# Patient Record
Sex: Male | Born: 1965 | Race: White | Hispanic: No | Marital: Married | Smoking: Never smoker
Health system: Southern US, Community
[De-identification: ages and names within clinical notes are randomized; demographics above are authoritative.]

## PROBLEM LIST (undated history)

## (undated) ENCOUNTER — Emergency Department (HOSPITAL_COMMUNITY): Payer: BC Managed Care – PPO

## (undated) DIAGNOSIS — M1990 Unspecified osteoarthritis, unspecified site: Secondary | ICD-10-CM

## (undated) DIAGNOSIS — M259 Joint disorder, unspecified: Secondary | ICD-10-CM

## (undated) DIAGNOSIS — J45909 Unspecified asthma, uncomplicated: Secondary | ICD-10-CM

## (undated) DIAGNOSIS — U071 COVID-19: Secondary | ICD-10-CM

## (undated) DIAGNOSIS — Z9109 Other allergy status, other than to drugs and biological substances: Secondary | ICD-10-CM

## (undated) DIAGNOSIS — T4145XA Adverse effect of unspecified anesthetic, initial encounter: Secondary | ICD-10-CM

## (undated) DIAGNOSIS — H9313 Tinnitus, bilateral: Secondary | ICD-10-CM

## (undated) DIAGNOSIS — J3081 Allergic rhinitis due to animal (cat) (dog) hair and dander: Secondary | ICD-10-CM

## (undated) DIAGNOSIS — Z98811 Dental restoration status: Secondary | ICD-10-CM

## (undated) DIAGNOSIS — S52042A Displaced fracture of coronoid process of left ulna, initial encounter for closed fracture: Secondary | ICD-10-CM

## (undated) DIAGNOSIS — S52122A Displaced fracture of head of left radius, initial encounter for closed fracture: Secondary | ICD-10-CM

## (undated) DIAGNOSIS — T148XXA Other injury of unspecified body region, initial encounter: Secondary | ICD-10-CM

## (undated) DIAGNOSIS — J189 Pneumonia, unspecified organism: Secondary | ICD-10-CM

## (undated) DIAGNOSIS — T8859XA Other complications of anesthesia, initial encounter: Secondary | ICD-10-CM

## (undated) DIAGNOSIS — R7303 Prediabetes: Secondary | ICD-10-CM

## (undated) DIAGNOSIS — G4730 Sleep apnea, unspecified: Secondary | ICD-10-CM

## (undated) DIAGNOSIS — I10 Essential (primary) hypertension: Secondary | ICD-10-CM

## (undated) DIAGNOSIS — K219 Gastro-esophageal reflux disease without esophagitis: Secondary | ICD-10-CM

## (undated) HISTORY — PX: SHOULDER ARTHROSCOPY: SHX128

## (undated) HISTORY — PX: ANKLE SURGERY: SHX546

---

## 1998-10-11 ENCOUNTER — Emergency Department (HOSPITAL_COMMUNITY): Admission: EM | Admit: 1998-10-11 | Discharge: 1998-10-11 | Payer: Self-pay | Admitting: Emergency Medicine

## 1998-10-11 ENCOUNTER — Encounter: Payer: Self-pay | Admitting: Emergency Medicine

## 1999-12-31 ENCOUNTER — Emergency Department (HOSPITAL_COMMUNITY): Admission: EM | Admit: 1999-12-31 | Discharge: 1999-12-31 | Payer: Self-pay | Admitting: Emergency Medicine

## 2001-07-25 ENCOUNTER — Emergency Department (HOSPITAL_COMMUNITY): Admission: EM | Admit: 2001-07-25 | Discharge: 2001-07-25 | Payer: Self-pay | Admitting: Emergency Medicine

## 2003-08-30 ENCOUNTER — Emergency Department (HOSPITAL_COMMUNITY): Admission: EM | Admit: 2003-08-30 | Discharge: 2003-08-30 | Payer: Self-pay | Admitting: Emergency Medicine

## 2007-02-09 ENCOUNTER — Emergency Department (HOSPITAL_COMMUNITY): Admission: EM | Admit: 2007-02-09 | Discharge: 2007-02-09 | Payer: Self-pay | Admitting: Emergency Medicine

## 2009-07-11 ENCOUNTER — Ambulatory Visit (HOSPITAL_BASED_OUTPATIENT_CLINIC_OR_DEPARTMENT_OTHER): Admission: RE | Admit: 2009-07-11 | Discharge: 2009-07-11 | Payer: Self-pay | Admitting: Orthopedic Surgery

## 2009-07-11 HISTORY — PX: CARPAL TUNNEL RELEASE: SHX101

## 2009-08-15 ENCOUNTER — Ambulatory Visit (HOSPITAL_BASED_OUTPATIENT_CLINIC_OR_DEPARTMENT_OTHER): Admission: RE | Admit: 2009-08-15 | Discharge: 2009-08-15 | Payer: Self-pay | Admitting: Orthopedic Surgery

## 2009-08-15 HISTORY — PX: CARPAL TUNNEL RELEASE: SHX101

## 2010-05-12 LAB — POCT HEMOGLOBIN-HEMACUE: Hemoglobin: 15.6 g/dL (ref 13.0–17.0)

## 2010-05-13 LAB — POCT HEMOGLOBIN-HEMACUE: Hemoglobin: 15.3 g/dL (ref 13.0–17.0)

## 2011-02-21 ENCOUNTER — Emergency Department (HOSPITAL_COMMUNITY)
Admission: EM | Admit: 2011-02-21 | Discharge: 2011-02-22 | Disposition: A | Discharge status: Home / Self Care | Payer: BC Managed Care – PPO | Attending: Emergency Medicine | Admitting: Emergency Medicine

## 2011-02-21 ENCOUNTER — Emergency Department (HOSPITAL_COMMUNITY): Payer: BC Managed Care – PPO

## 2011-02-21 ENCOUNTER — Encounter: Payer: Self-pay | Admitting: *Deleted

## 2011-02-21 DIAGNOSIS — J3489 Other specified disorders of nose and nasal sinuses: Secondary | ICD-10-CM | POA: Insufficient documentation

## 2011-02-21 DIAGNOSIS — R509 Fever, unspecified: Secondary | ICD-10-CM | POA: Insufficient documentation

## 2011-02-21 DIAGNOSIS — R05 Cough: Secondary | ICD-10-CM | POA: Insufficient documentation

## 2011-02-21 DIAGNOSIS — R42 Dizziness and giddiness: Secondary | ICD-10-CM | POA: Insufficient documentation

## 2011-02-21 DIAGNOSIS — R0789 Other chest pain: Secondary | ICD-10-CM | POA: Insufficient documentation

## 2011-02-21 DIAGNOSIS — J45909 Unspecified asthma, uncomplicated: Secondary | ICD-10-CM | POA: Insufficient documentation

## 2011-02-21 DIAGNOSIS — J111 Influenza due to unidentified influenza virus with other respiratory manifestations: Secondary | ICD-10-CM | POA: Insufficient documentation

## 2011-02-21 DIAGNOSIS — R059 Cough, unspecified: Secondary | ICD-10-CM | POA: Insufficient documentation

## 2011-02-21 DIAGNOSIS — R0602 Shortness of breath: Secondary | ICD-10-CM | POA: Insufficient documentation

## 2011-02-21 DIAGNOSIS — R231 Pallor: Secondary | ICD-10-CM | POA: Insufficient documentation

## 2011-02-21 DIAGNOSIS — R000 Tachycardia, unspecified: Secondary | ICD-10-CM | POA: Insufficient documentation

## 2011-02-21 DIAGNOSIS — R070 Pain in throat: Secondary | ICD-10-CM | POA: Insufficient documentation

## 2011-02-21 DIAGNOSIS — R5381 Other malaise: Secondary | ICD-10-CM | POA: Insufficient documentation

## 2011-02-21 DIAGNOSIS — Z79899 Other long term (current) drug therapy: Secondary | ICD-10-CM | POA: Insufficient documentation

## 2011-02-21 DIAGNOSIS — R112 Nausea with vomiting, unspecified: Secondary | ICD-10-CM | POA: Insufficient documentation

## 2011-02-21 DIAGNOSIS — IMO0001 Reserved for inherently not codable concepts without codable children: Secondary | ICD-10-CM | POA: Insufficient documentation

## 2011-02-21 MED ORDER — ONDANSETRON HCL 4 MG/2ML IJ SOLN
4.0000 mg | Freq: Once | INTRAMUSCULAR | Status: AC
  Administered 2011-02-21: 4 mg via INTRAVENOUS
  Filled 2011-02-21: qty 2

## 2011-02-21 MED ORDER — SODIUM CHLORIDE 0.9 % IV BOLUS (SEPSIS)
1000.0000 mL | Freq: Once | INTRAVENOUS | Status: AC
  Administered 2011-02-21: 1000 mL via INTRAVENOUS

## 2011-02-21 MED ORDER — SODIUM CHLORIDE 0.9 % IV BOLUS (SEPSIS): 1000.0000 mL | Freq: Once | INTRAVENOUS | Status: DC

## 2011-02-21 MED ORDER — IPRATROPIUM BROMIDE 0.02 % IN SOLN
0.5000 mg | Freq: Once | RESPIRATORY_TRACT | Status: AC
  Administered 2011-02-21: 0.5 mg via RESPIRATORY_TRACT
  Filled 2011-02-21: qty 2.5

## 2011-02-21 MED ORDER — ALBUTEROL SULFATE (5 MG/ML) 0.5% IN NEBU
2.5000 mg | INHALATION_SOLUTION | Freq: Once | RESPIRATORY_TRACT | Status: AC
  Administered 2011-02-21: 2.5 mg via RESPIRATORY_TRACT
  Filled 2011-02-21: qty 0.5

## 2011-02-21 NOTE — ED Provider Notes (Signed)
Medical screening examination/treatment/procedure(s) were performed by non-physician practitioner and as supervising physician I was immediately available for consultation/collaboration.  Jacquis Paxton T Sahra Converse, MD 02/21/11 2340 

## 2011-02-21 NOTE — ED Notes (Signed)
Bed:WA04<BR> Expected date:<BR> Expected time:<BR> Means of arrival:<BR> Comments:<BR> hold

## 2011-02-21 NOTE — ED Notes (Signed)
Per EMS- pt in c/o flu like symptoms x4 days, c/o cough, n/v, fever and body aches

## 2011-02-21 NOTE — ED Provider Notes (Deleted)
History     CSN: [id number]  Arrival date & time 02/21/11  2008   First MD Initiated Contact with Patient 02/21/11 2045      Chief Complaint  Patient presents with  . Influenza    (Consider location/radiation/quality/duration/timing/severity/associated sxs/prior treatment) HPI Comments: This patient has had 4 days of flulike symptoms including nausea, vomiting, shortness of breath, cough, fevers, myalgias, has been taking over-the-counter medications with minimal relief.  Has an underlying history of asthma.  Tonight felt like he could not catch his breath and had a heaviness across his chest  Patient is a 45 y.o. male presenting with flu symptoms. The history is provided by the patient.  Influenza This is a new problem. The current episode started in the past 7 days. The problem occurs constantly. The problem has been gradually worsening. Associated symptoms include chills, congestion, coughing, a fever, myalgias, nausea, a sore throat, vomiting and weakness.    Past Medical History  Diagnosis Date  . Asthma     History reviewed. No pertinent past surgical history.  History reviewed. No pertinent family history.  History  Substance Use Topics  . Smoking status: Not on file  . Smokeless tobacco: Not on file  . Alcohol Use:       Review of Systems  Constitutional: Positive for fever and chills.  HENT: Positive for congestion and sore throat. Negative for rhinorrhea.   Respiratory: Positive for cough, chest tightness and shortness of breath. Negative for wheezing.   Gastrointestinal: Positive for nausea and vomiting.  Musculoskeletal: Positive for myalgias.  Skin: Positive for pallor.  Neurological: Positive for dizziness and weakness.  Psychiatric/Behavioral: Negative.     Allergies  Biaxin  Home Medications   Current Outpatient Rx  Name Route Sig Dispense Refill  . FLUTICASONE-SALMETEROL 100-50 MCG/DOSE IN AEPB Inhalation Inhale 1 puff into the lungs every  12 (twelve) hours.      Marland Kitchen LORATADINE 10 MG PO TABS Oral Take 10 mg by mouth daily.      Marland Kitchen MONTELUKAST SODIUM 10 MG PO TABS Oral Take 10 mg by mouth at bedtime.      Marland Kitchen AMLODIPINE BESYLATE 10 MG PO TABS Oral Take 10 mg by mouth daily.      Marland Kitchen BENAZEPRIL HCL 10 MG PO TABS Oral Take 10 mg by mouth daily.      Marland Kitchen ONDANSETRON HCL 4 MG PO TABS Oral Take 1 tablet (4 mg total) by mouth every 6 (six) hours. 12 tablet 0    BP 146/79  Pulse 94  Temp(Src) 100 F (37.8 C) (Oral)  Resp 18  SpO2 98%  Physical Exam  Constitutional: He is oriented to person, place, and time. He appears well-developed and well-nourished.  HENT:  Head: Normocephalic.  Eyes: Pupils are equal, round, and reactive to light.  Neck: Normal range of motion.  Cardiovascular: Tachycardia present.   Pulmonary/Chest: Effort normal and breath sounds normal. He has no wheezes. He exhibits no tenderness.  Musculoskeletal: He exhibits no tenderness.  Neurological: He is oriented to person, place, and time.  Skin: Skin is warm and dry. There is pallor.    ED Course  Procedures (including critical care time)  Labs Reviewed - No data to display Dg Chest 2 View  02/21/2011  *RADIOLOGY REPORT*  Clinical Data: Cough, shortness of breath, and chest pain.  CHEST - 2 VIEW  Comparison: None.  Findings: Slightly shallow inspiration.  Mild central peribronchial thickening suggesting changes of bronchitis.  No focal airspace consolidation in the  lungs.  No blunting of costophrenic angles. Normal heart size and pulmonary vascularity for technique. Degenerative changes in the thoracic spine.  Old left rib fractures.  IMPRESSION: Peribronchial thickening suggesting bronchitis.  No focal airspace consolidation.  Shallow inspiration.  Original Report Authenticated By: Marlon Pel, M.D.     1. Influenza     Patient still tachycardic, will administer additional IV fluids  MDM  Flulike symptoms for the last 4 days.  Will hydrate provide  antiemetics view x-ray, which is new pneumonia, but her central bronchial thickening.  Will administer albuterol, Atrovent neb reassess patient        Arman Filter, NP 02/21/11 2128  Arman Filter, NP 02/22/11 0058  Arman Filter, NP 02/26/11 1959  Arman Filter, NP 02/26/11 2000

## 2011-02-22 MED ORDER — ONDANSETRON HCL 4 MG PO TABS: 4.0000 mg | ORAL_TABLET | Freq: Four times a day (QID) | ORAL | Status: AC

## 2011-02-22 MED ORDER — SODIUM CHLORIDE 0.9 % IV BOLUS (SEPSIS)
1000.0000 mL | Freq: Once | INTRAVENOUS | Status: AC
  Administered 2011-02-22: 1000 mL via INTRAVENOUS

## 2011-02-22 MED ORDER — OXYCODONE-ACETAMINOPHEN 5-325 MG PO TABS
1.0000 | ORAL_TABLET | Freq: Once | ORAL | Status: AC
  Administered 2011-02-22: 1 via ORAL
  Filled 2011-02-22: qty 1

## 2011-02-26 NOTE — ED Provider Notes (Signed)
Medical screening examination/treatment/procedure(s) were performed by non-physician practitioner and as supervising physician I was immediately available for consultation/collaboration.   Lyanne Co, MD 02/26/11 2227

## 2013-08-15 ENCOUNTER — Encounter (INDEPENDENT_AMBULATORY_CARE_PROVIDER_SITE_OTHER): Payer: Self-pay

## 2013-08-15 ENCOUNTER — Other Ambulatory Visit: Payer: Self-pay | Admitting: Internal Medicine

## 2013-08-15 ENCOUNTER — Ambulatory Visit
Admission: RE | Admit: 2013-08-15 | Discharge: 2013-08-15 | Disposition: A | Discharge status: Home / Self Care | Payer: No Typology Code available for payment source | Source: Ambulatory Visit | Attending: Internal Medicine | Admitting: Internal Medicine

## 2013-08-15 DIAGNOSIS — M545 Low back pain: Secondary | ICD-10-CM

## 2013-09-23 ENCOUNTER — Ambulatory Visit (INDEPENDENT_AMBULATORY_CARE_PROVIDER_SITE_OTHER): Payer: PRIVATE HEALTH INSURANCE

## 2013-09-23 VITALS — BP 113/76 | HR 74 | Resp 16 | Ht 68.0 in | Wt 270.0 lb

## 2013-09-23 DIAGNOSIS — M7750 Other enthesopathy of unspecified foot: Secondary | ICD-10-CM

## 2013-09-23 DIAGNOSIS — M79673 Pain in unspecified foot: Secondary | ICD-10-CM

## 2013-09-23 DIAGNOSIS — M79609 Pain in unspecified limb: Secondary | ICD-10-CM

## 2013-09-23 DIAGNOSIS — G5760 Lesion of plantar nerve, unspecified lower limb: Secondary | ICD-10-CM

## 2013-09-23 DIAGNOSIS — M779 Enthesopathy, unspecified: Secondary | ICD-10-CM

## 2013-09-23 DIAGNOSIS — G5790 Unspecified mononeuropathy of unspecified lower limb: Secondary | ICD-10-CM

## 2013-09-23 DIAGNOSIS — M778 Other enthesopathies, not elsewhere classified: Secondary | ICD-10-CM

## 2013-09-23 MED ORDER — MELOXICAM 15 MG PO TABS: 15.0000 mg | ORAL_TABLET | Freq: Every day | ORAL | Status: DC

## 2013-09-23 NOTE — Progress Notes (Signed)
   Subjective:    Patient ID: Carl Graves, male    DOB: [birth year], 48 y.o.   MRN: [id number]  HPI Comments: "I have something going on with my feet"  Patient c/o tingling, numbness and discomfort forefoot and 4th and 5th toes bilateral for about 6 months. The tingling just started recently. He has experienced some burning and swelling lately. "The sheets on my bed make them sensitive now" He has seen PCP and he said he needed to lose weight and get new shoes. He's been using lotion at home.      Review of Systems  Constitutional: Positive for fatigue.  HENT: Positive for sinus pressure and tinnitus.   Eyes: Positive for redness and itching.  Respiratory: Positive for cough, chest tightness, shortness of breath and wheezing.   Musculoskeletal: Positive for back pain.  All other systems reviewed and are negative.      Objective:   Physical Exam 48 year old white male well-developed well-nourished oriented x3 presents at this time with continued pain and burning or appears to be neurologic type symptoms lateral aspects of both feet. Patient wearing athletic shoes with good foot orthotic lower extremity objective findings reveal intact neurovascular status with pedal pulses palpable DP +2/4 bilateral PT plus one over 4 bilateral capillary refill time 3 seconds all digits epicritic and proprioceptive sensations intact on Semmes Weinstein testing her patient does have hyperesthesia times and abnormal sensations feels like his socks or pulling or shifting shoes are rolling at times. Neurologically skin color pigment normal hair growth present but diminished distally nails are unremarkable speed biomechanical exam rectus foot mild digital contractures are noted no signs of fracture or other osseous abnormalities the hallux and lesser digits are rectus metatarsals are rectus mild inferior calcaneal spurring of fascial thickening noted there may be some slight subluxation Lisfranc for fifth metatarsal  base and cuboid bilateral. There is pain on direct lateral compression of the forefoot a lateral compression of third fourth and fifth toes producing some mild symptomology or paresthesia. Patient has a history of carpal tunnel and may also have similar neurologic absence of the foot. 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 At this time patient placed on a regimen of MOBIC 15 mg once daily also recommended straight shoe and ice pack to help reduce swelling and inflammation if no improvement with the next month followup for reevaluation other alternatives possibly gabapentin steroid injections or custom orthotics and shoe adjustments can be considered in the future.  Alvan Dameichard Reet Scharrer DPM

## 2013-09-23 NOTE — Patient Instructions (Signed)
ICE INSTRUCTIONS  Apply ice or cold pack to the affected area at least 3 times a day for 10-15 minutes each time.  You should also use ice after prolonged activity or vigorous exercise.  Do not apply ice longer than 20 minutes at one time.  Always keep a cloth between your skin and the ice pack to prevent burns.  Being consistent and following these instructions will help control your symptoms.  We suggest you purchase a gel ice pack because they are reusable and do bit leak.  Some of them are designed to wrap around the area.  Use the method that works best for you.  Here are some other suggestions for icing.   Use a frozen bag of peas or corn-inexpensive and molds well to your body, usually stays frozen for 10 to 20 minutes.  Wet a towel with cold water and squeeze out the excess until it's damp.  Place in a bag in the freezer for 20 minutes. Then remove and use.   Morton's Neuroma in Sports  (Interdigital Plantar Neuroma) Morton's neuroma is a condition of the nervous system that results in pain or loss of feeling in the toes. The disease is caused by the bones of the foot squeezing the nerve that runs between two toes (interdigital nerve). The third and fourth toes are most likely to be affected by this disease. SYMPTOMS   Tingling, numbness, burning, or electric shocks in the front of the foot, often involving the third and fourth toes, although it may involve any other pair of toes.  Pain and tenderness in the front of the foot, that gets worse when walking.  Pain that gets worse when pressure is applied to the foot (wearing shoes).  Severe pain in the front of the foot, when standing on the front of the foot (on tiptoes), such as with running, jumping, pivoting, or dancing. CAUSES  Morton's neuroma is caused by swelling of the nerve between two toes. This swelling causes the nerve to be pinched between the bones of the foot. RISK INCREASES WITH:  Recurring foot or ankle  injuries.  Poor fitting or worn shoes, with minimal padding and shock absorbers.  Loose ligaments of the foot, causing thickening of the nerve.  Poor foot strength and flexibility. PREVENTION  Warm up and stretch properly before activity.  Maintain physical fitness:  Foot and ankle flexibility.  Muscle strength and endurance.  Cardiovascular fitness.  Wear properly fitted and padded shoes.  Wear arch supports (orthotics), when needed. PROGNOSIS  If treated properly, Morton's neuroma can usually be cured with non-surgical treatment. For certain cases, surgery may be needed. RELATED COMPLICATIONS  Permanent numbness and pain in the foot.  Inability to participate in athletics, because of pain. TREATMENT Treatment first involves stopping any activities that make the symptoms worse. The use of ice and medicine will help reduce pain and inflammation. Wearing shoes with a wide toe box, and an orthotic arch support or metatarsal bar, may also reduce pain. Your caregiver may give you a corticosteroid injection, to further reduce inflammation. If non-surgical treatment is unsuccessful, surgery may be needed. Surgery to fix Morton's neuroma is often performed as an outpatient procedure, meaning you can go home the same day as the surgery. The procedure involves removing the source of pressure on the nerve. If it is necessary to remove the nerve, you can expect persistent numbness. MEDICATION  If pain medicine is needed, nonsteroidal anti-inflammatory medicines (aspirin and ibuprofen), or other minor pain relievers (  acetaminophen), are often advised.  Do not take pain medicine for 7 days before surgery.  Prescription pain relievers are usually prescribed only after surgery. Use only as directed and only as much as you need.  Corticosteroid injections are used in extreme cases, to reduce inflammation. These injections should be done only if necessary, because they may be given only a  limited number of times. HEAT AND COLD  Cold treatment (icing) should be applied for 10 to 15 minutes every 2 to 3 hours for inflammation and pain, and immediately after activity that aggravates your symptoms. Use ice packs or an ice massage.  Heat treatment may be used before performing stretching and strengthening activities prescribed by your caregiver, physical therapist, or athletic trainer. Use a heat pack or a warm water soak. SEEK MEDICAL CARE IF:   Symptoms get worse or do not improve in 2 weeks, despite treatment.  After surgery you develop increasing pain, swelling, redness, increased warmth, bleeding, drainage of fluids, or fever.  New, unexplained symptoms develop. (Drugs used in treatment may produce side effects.) Document Released: 12/18/2004 Document Revised: 05/05/2011 Document Reviewed: 05/25/2008 [facility] Patient Information 2015 [place], [place]. This information is not intended to replace advice given to you by your health care provider. Make sure you discuss any questions you have with your health care provider.   Neuroma or pinched nerve symptoms can be caused by improper fitting shoes. Maintain a straight last rather than a curved last shoe to prevent irritation or squeezing against the fourth and fifth toes

## 2014-02-23 ENCOUNTER — Encounter (HOSPITAL_COMMUNITY): Payer: Self-pay | Admitting: Family Medicine

## 2014-02-23 ENCOUNTER — Emergency Department (HOSPITAL_COMMUNITY): Payer: No Typology Code available for payment source

## 2014-02-23 ENCOUNTER — Emergency Department (HOSPITAL_COMMUNITY)
Admission: EM | Admit: 2014-02-23 | Discharge: 2014-02-23 | Disposition: A | Discharge status: Home / Self Care | Payer: No Typology Code available for payment source | Attending: Emergency Medicine | Admitting: Emergency Medicine

## 2014-02-23 DIAGNOSIS — R1110 Vomiting, unspecified: Secondary | ICD-10-CM | POA: Diagnosis not present

## 2014-02-23 DIAGNOSIS — Z7982 Long term (current) use of aspirin: Secondary | ICD-10-CM | POA: Diagnosis not present

## 2014-02-23 DIAGNOSIS — Z79899 Other long term (current) drug therapy: Secondary | ICD-10-CM | POA: Insufficient documentation

## 2014-02-23 DIAGNOSIS — J45901 Unspecified asthma with (acute) exacerbation: Secondary | ICD-10-CM | POA: Diagnosis not present

## 2014-02-23 DIAGNOSIS — R05 Cough: Secondary | ICD-10-CM | POA: Diagnosis present

## 2014-02-23 DIAGNOSIS — J029 Acute pharyngitis, unspecified: Secondary | ICD-10-CM | POA: Insufficient documentation

## 2014-02-23 DIAGNOSIS — R059 Cough, unspecified: Secondary | ICD-10-CM

## 2014-02-23 DIAGNOSIS — J40 Bronchitis, not specified as acute or chronic: Secondary | ICD-10-CM

## 2014-02-23 LAB — I-STAT CHEM 8, ED
BUN: 15 mg/dL (ref 6–23)
CREATININE: 0.7 mg/dL (ref 0.50–1.35)
Calcium, Ion: 1.15 mmol/L (ref 1.12–1.23)
Chloride: 102 mEq/L (ref 96–112)
GLUCOSE: 102 mg/dL — AB (ref 70–99)
HEMATOCRIT: 47 % (ref 39.0–52.0)
HEMOGLOBIN: 16 g/dL (ref 13.0–17.0)
Potassium: 3.7 mmol/L (ref 3.5–5.1)
SODIUM: 139 mmol/L (ref 135–145)
TCO2: 22 mmol/L (ref 0–100)

## 2014-02-23 MED ORDER — SODIUM CHLORIDE 0.9 % IV BOLUS (SEPSIS)
1000.0000 mL | Freq: Once | INTRAVENOUS | Status: AC
  Administered 2014-02-23: 1000 mL via INTRAVENOUS

## 2014-02-23 MED ORDER — PREDNISONE 50 MG PO TABS: 50.0000 mg | ORAL_TABLET | Freq: Every day | ORAL | Status: DC

## 2014-02-23 MED ORDER — ONDANSETRON HCL 4 MG PO TABS: 4.0000 mg | ORAL_TABLET | Freq: Four times a day (QID) | ORAL | Status: DC

## 2014-02-23 MED ORDER — ONDANSETRON HCL 4 MG/2ML IJ SOLN
4.0000 mg | Freq: Once | INTRAMUSCULAR | Status: AC
  Administered 2014-02-23: 4 mg via INTRAVENOUS
  Filled 2014-02-23: qty 2

## 2014-02-23 NOTE — Discharge Instructions (Signed)

## 2014-02-23 NOTE — ED Notes (Addendum)
Pt O2 sat at 86% placed 2L of 02 via Nasal Cannula. SPO2 improved to 96%.

## 2014-02-23 NOTE — ED Provider Notes (Signed)
CSN: [id number]     Arrival date & time 02/23/14  1014 History   First MD Initiated Contact with Patient 02/23/14 1025     Chief Complaint  Patient presents with  . Cough  . Sore Throat   Patient is a [age] y.o. male presenting with cough and pharyngitis.  Cough Sore Throat   Started this past weekend.  Initially it was a sore throat but then he started coughing.  [facility]. He was given a rx for thrush as well as cough medications.  His cough has been getting worse. He did not sleep well last night.  He has been vomiting but mostly after he coughs.  He feels tight in his chest and feel short of breath.  No fevers.,  Temp to 99. Past Medical History  Diagnosis Date  . Asthma    History reviewed. No pertinent past surgical history. History reviewed. No pertinent family history. History  Substance Use Topics  . Smoking status: Never Smoker   . Smokeless tobacco: Not on file  . Alcohol Use: Yes    Review of Systems  Respiratory: Positive for cough.   All other systems reviewed and are negative.     Allergies  Clarithromycin  Home Medications   Prior to Admission medications   Medication Sig Start Date End Date Taking? Authorizing Provider  albuterol (PROVENTIL HFA;VENTOLIN HFA) 108 (90 BASE) MCG/ACT inhaler Inhale 1-2 puffs into the lungs every 6 (six) hours as needed for wheezing or shortness of breath.   Yes Historical Provider, MD  albuterol (PROVENTIL) (2.5 MG/3ML) 0.083% nebulizer solution Take 2.5 mg by nebulization every 6 (six) hours as needed for wheezing or shortness of breath.   Yes Historical Provider, MD  amLODipine (NORVASC) 10 MG tablet Take 5 mg by mouth daily.    Yes Historical Provider, MD  aspirin 81 MG tablet Take 81 mg by mouth daily.   Yes Historical Provider, MD  benazepril (LOTENSIN) 20 MG tablet Take 20 mg by mouth daily.   Yes Historical Provider, MD  benzonatate (TESSALON) 100 MG capsule Take 100 mg by  mouth 3 (three) times daily as needed for cough.   Yes Historical Provider, MD  loratadine (CLARITIN) 10 MG tablet Take 10 mg by mouth daily.     Yes Historical Provider, MD  montelukast (SINGULAIR) 10 MG tablet Take 10 mg by mouth at bedtime.     Yes Historical Provider, MD  nystatin (MYCOSTATIN) 100000 UNIT/ML suspension Take 5 mLs by mouth 4 (four) times daily.   Yes Historical Provider, MD  Omega-3 Fatty Acids (FISH OIL PO) Take 1,200 mg by mouth daily.    Yes Historical Provider, MD  meloxicam (MOBIC) 15 MG tablet Take 1 tablet (15 mg total) by mouth daily. Patient not taking: Reported on 02/23/2014 09/23/13   Alvan Dameichard Sikora, DPM  ondansetron (ZOFRAN) 4 MG tablet Take 1 tablet (4 mg total) by mouth every 6 (six) hours. 02/23/14   Linwood DibblesJon Malakye Nolden, MD  predniSONE (DELTASONE) 50 MG tablet Take 1 tablet (50 mg total) by mouth daily. 02/23/14   Linwood DibblesJon Adrian Dinovo, MD   BP 125/75 mmHg  Pulse 88  Temp(Src) 98.4 F (36.9 C)  Resp 22  SpO2 92% Physical Exam  Constitutional: He appears well-developed and well-nourished. No distress.  HENT:  Head: Normocephalic and atraumatic.  Right Ear: External ear normal.  Left Ear: External ear normal.  Eyes: Conjunctivae are normal. Right eye exhibits no discharge. Left  eye exhibits no discharge. No scleral icterus.  Neck: Neck supple. No tracheal deviation present.  Cardiovascular: Normal rate, regular rhythm and intact distal pulses.   Pulmonary/Chest: Effort normal. No stridor. No respiratory distress. He has decreased breath sounds in the right lower field and the left lower field. He has no wheezes. He has rhonchi. He has no rales.  Abdominal: Soft. Bowel sounds are normal. He exhibits no distension. There is no tenderness. There is no rebound and no guarding.  Musculoskeletal: He exhibits no edema or tenderness.  Neurological: He is alert. He has normal strength. No cranial nerve deficit (no facial droop, extraocular movements intact, no slurred speech) or sensory  deficit. He exhibits normal muscle tone. He displays no seizure activity. Coordination normal.  Skin: Skin is warm and dry. No rash noted.  Psychiatric: He has a normal mood and affect.  Nursing note and vitals reviewed.   ED Course  Procedures (including critical care time) Labs Review Labs Reviewed  I-STAT CHEM 8, ED - Abnormal; Notable for the following:    Glucose, Bld 102 (*)    All other components within normal limits    Imaging Review Dg Chest 2 View  02/23/2014   CLINICAL DATA:  Productive cough for 5 days.  History of asthma  EXAM: CHEST  2 VIEW  COMPARISON:  February 21, 2011  FINDINGS: There is mild generalized interstitial prominence and central peribronchial thickening, stable. There is no frank edema or consolidation. The heart size and pulmonary vascularity are normal. No adenopathy. There is degenerative change in the thoracic spine.  IMPRESSION: Generalized interstitial prominence, most likely reflective of chronic inflammatory type change. This finding may be at least in part due to the chronic asthma. There is central peribronchial thickening which appears chronic and may be indicative of chronic bronchitis as well. No frank edema or consolidation.   Electronically Signed   By: Bretta BangWilliam  Woodruff M.D.   On: 02/23/2014 11:25   Medications  sodium chloride 0.9 % bolus 1,000 mL (1,000 mLs Intravenous New Bag/Given 02/23/14 1104)  ondansetron (ZOFRAN) injection 4 mg (4 mg Intravenous Given 02/23/14 1104)     MDM   Final diagnoses:  Cough  Bronchitis  Post-tussive emesis    Most likely viral trigger.  No pna on xray.  Does have history of reactive airway disease.  Will rx prednisone.  zofran for nausea.  Follow up with PCP    Linwood DibblesJon Jaan Fischel, MD 02/23/14 1320

## 2014-02-23 NOTE — ED Notes (Signed)
Pt comfortable with discharge and follow up instructions. Prescriptions x2. 

## 2014-02-23 NOTE — ED Notes (Signed)
Pt here for sore throat and coughing since Saturday. sts he cannot lay flat. sts [facility] and had [facility]. sts night sweats and he vomited 4 times this am.

## 2014-12-10 ENCOUNTER — Emergency Department (HOSPITAL_COMMUNITY): Payer: No Typology Code available for payment source

## 2014-12-10 ENCOUNTER — Encounter (HOSPITAL_COMMUNITY): Payer: Self-pay | Admitting: Emergency Medicine

## 2014-12-10 ENCOUNTER — Emergency Department (HOSPITAL_COMMUNITY)
Admission: EM | Admit: 2014-12-10 | Discharge: 2014-12-10 | Disposition: A | Discharge status: Home / Self Care | Payer: No Typology Code available for payment source | Attending: Emergency Medicine | Admitting: Emergency Medicine

## 2014-12-10 DIAGNOSIS — Z7982 Long term (current) use of aspirin: Secondary | ICD-10-CM | POA: Diagnosis not present

## 2014-12-10 DIAGNOSIS — S52122A Displaced fracture of head of left radius, initial encounter for closed fracture: Secondary | ICD-10-CM | POA: Insufficient documentation

## 2014-12-10 DIAGNOSIS — S4992XA Unspecified injury of left shoulder and upper arm, initial encounter: Secondary | ICD-10-CM | POA: Diagnosis present

## 2014-12-10 DIAGNOSIS — Y9289 Other specified places as the place of occurrence of the external cause: Secondary | ICD-10-CM | POA: Insufficient documentation

## 2014-12-10 DIAGNOSIS — S62102A Fracture of unspecified carpal bone, left wrist, initial encounter for closed fracture: Secondary | ICD-10-CM

## 2014-12-10 DIAGNOSIS — J45909 Unspecified asthma, uncomplicated: Secondary | ICD-10-CM | POA: Insufficient documentation

## 2014-12-10 DIAGNOSIS — Z79899 Other long term (current) drug therapy: Secondary | ICD-10-CM | POA: Insufficient documentation

## 2014-12-10 DIAGNOSIS — S52042A Displaced fracture of coronoid process of left ulna, initial encounter for closed fracture: Secondary | ICD-10-CM

## 2014-12-10 DIAGNOSIS — Y9389 Activity, other specified: Secondary | ICD-10-CM | POA: Diagnosis not present

## 2014-12-10 DIAGNOSIS — W010XXA Fall on same level from slipping, tripping and stumbling without subsequent striking against object, initial encounter: Secondary | ICD-10-CM | POA: Diagnosis not present

## 2014-12-10 DIAGNOSIS — I10 Essential (primary) hypertension: Secondary | ICD-10-CM | POA: Insufficient documentation

## 2014-12-10 DIAGNOSIS — Z7952 Long term (current) use of systemic steroids: Secondary | ICD-10-CM | POA: Insufficient documentation

## 2014-12-10 DIAGNOSIS — Y998 Other external cause status: Secondary | ICD-10-CM | POA: Diagnosis not present

## 2014-12-10 DIAGNOSIS — T148XXA Other injury of unspecified body region, initial encounter: Secondary | ICD-10-CM

## 2014-12-10 DIAGNOSIS — M25532 Pain in left wrist: Secondary | ICD-10-CM

## 2014-12-10 HISTORY — DX: Displaced fracture of coronoid process of left ulna, initial encounter for closed fracture: S52.042A

## 2014-12-10 HISTORY — DX: Other injury of unspecified body region, initial encounter: T14.8XXA

## 2014-12-10 HISTORY — DX: Essential (primary) hypertension: I10

## 2014-12-10 HISTORY — DX: Displaced fracture of head of left radius, initial encounter for closed fracture: S52.122A

## 2014-12-10 MED ORDER — HYDROCODONE-ACETAMINOPHEN 5-325 MG PO TABS: 1.0000 | ORAL_TABLET | Freq: Four times a day (QID) | ORAL | Status: DC | PRN

## 2014-12-10 MED ORDER — HYDROCODONE-ACETAMINOPHEN 5-325 MG PO TABS
2.0000 | ORAL_TABLET | Freq: Once | ORAL | Status: AC
  Administered 2014-12-10: 2 via ORAL
  Filled 2014-12-10: qty 2

## 2014-12-10 NOTE — ED Provider Notes (Addendum)
CSN: [id number]     Arrival date & time 12/10/14  1734 History   First MD Initiated Contact with Patient 12/10/14 1937     Chief Complaint  Patient presents with  . Arm Pain     (Consider location/radiation/quality/duration/timing/severity/associated sxs/prior Treatment) Patient is a 49 y.o. male presenting with arm pain. The history is provided by the patient.  Arm Pain Pertinent negatives include no headaches.  Patient c/o pain/injury to left elbow and wrist. States was getting a mower off the back of a truck, when he tripped, fell forward onto outstretched left arm.  Patient c/o constant, mod-severe, left elbow pain, moderate left wrist pain, worse w movement, esp rotation. No associated numbness or weakness. Skin intact. No shoulder pain. No neck or back pain. No head injury or headache. Patient denies any other pain or injury.  Right hand dom.    Past Medical History  Diagnosis Date  . Asthma   . Hypertension    No past surgical history on file. No family history on file. Social History  Substance Use Topics  . Smoking status: Never Smoker   . Smokeless tobacco: None  . Alcohol Use: Yes    Review of Systems  Constitutional: Negative for fever.  Musculoskeletal: Negative for back pain and neck pain.  Skin: Negative for wound.  Neurological: Negative for weakness, numbness and headaches.      Allergies  Clarithromycin  Home Medications   Prior to Admission medications   Medication Sig Start Date End Date Taking? Authorizing Provider  albuterol (PROVENTIL HFA;VENTOLIN HFA) 108 (90 BASE) MCG/ACT inhaler Inhale 1-2 puffs into the lungs every 6 (six) hours as needed for wheezing or shortness of breath.    Historical Provider, MD  albuterol (PROVENTIL) (2.5 MG/3ML) 0.083% nebulizer solution Take 2.5 mg by nebulization every 6 (six) hours as needed for wheezing or shortness of breath.    Historical Provider, MD  amLODipine (NORVASC) 10 MG tablet Take 5 mg by mouth  daily.     Historical Provider, MD  aspirin 81 MG tablet Take 81 mg by mouth daily.    Historical Provider, MD  benazepril (LOTENSIN) 20 MG tablet Take 20 mg by mouth daily.    Historical Provider, MD  benzonatate (TESSALON) 100 MG capsule Take 100 mg by mouth 3 (three) times daily as needed for cough.    Historical Provider, MD  loratadine (CLARITIN) 10 MG tablet Take 10 mg by mouth daily.      Historical Provider, MD  meloxicam (MOBIC) 15 MG tablet Take 1 tablet (15 mg total) by mouth daily. Patient not taking: Reported on 02/23/2014 09/23/13   Alvan Dame, DPM  montelukast (SINGULAIR) 10 MG tablet Take 10 mg by mouth at bedtime.      Historical Provider, MD  nystatin (MYCOSTATIN) 100000 UNIT/ML suspension Take 5 mLs by mouth 4 (four) times daily.    Historical Provider, MD  Omega-3 Fatty Acids (FISH OIL PO) Take 1,200 mg by mouth daily.     Historical Provider, MD  ondansetron (ZOFRAN) 4 MG tablet Take 1 tablet (4 mg total) by mouth every 6 (six) hours. 02/23/14   Linwood Dibbles, MD  predniSONE (DELTASONE) 50 MG tablet Take 1 tablet (50 mg total) by mouth daily. 02/23/14   Linwood Dibbles, MD   There were no vitals taken for this visit. Physical Exam  Constitutional: He is oriented to person, place, and time. He appears well-developed and well-nourished. No distress.  HENT:  Head: Atraumatic.  Eyes: Conjunctivae are normal.  Neck: Neck supple. No tracheal deviation present.  Cardiovascular: Normal rate and intact distal pulses.   Pulmonary/Chest: Effort normal. No accessory muscle usage. No respiratory distress.  Musculoskeletal: He exhibits tenderness.  Mild sts/tenderness left elbow and wrist. Pt holds left elbow flexed to 90 degrees. Radial pulse 2+. Mod tenderness at elbow. Mild diffuse wrist tenderness, no focal scaphoid tenderness. Forearm compartments soft, not tense. CTLS spine, non tender, aligned, no step off. Good rom left shoulder without pain.   Neurological: He is alert and oriented  to person, place, and time.  LUE, radial/median/unlar nerve fxn, motor and sensory, intact.   Skin: Skin is warm and dry. He is not diaphoretic.  Intact, no wounds.   Psychiatric: He has a normal mood and affect.  Nursing note and vitals reviewed.   ED Course  Procedures (including critical care time)  Dg Forearm Left  12/10/2014  CLINICAL DATA:  Trip and fall landing on left forearm. Now with left forearm pain and decreased range of motion. EXAM: LEFT FOREARM - 2 VIEW COMPARISON:  None. FINDINGS: There is a mildly displaced fracture of the radial neck, with questionable extension to the articular surface of the radial head. Questionable small elbow joint effusion. The more distal radius and ulna are intact. Tiny olecranon spur. There are no radiopaque foreign bodies. IMPRESSION: Mildly displaced radial neck fracture with questionable extension to the articular surface of the radial head. Electronically Signed   By: Rubye OaksMelanie  Ehinger M.D.   On: 12/10/2014 18:14   Dg Wrist 2 Views Left  12/10/2014  CLINICAL DATA:  Status post trip and fall today with left wrist injury and pain. Initial encounter. EXAM: LEFT WRIST - 2 VIEW COMPARISON:  None. FINDINGS: No acute bony or joint abnormality is identified. There is ulnar minus variance. Chondrocalcinosis of the triangular fibrocartilage is noted. IMPRESSION: No acute abnormality. Ulnar minus variance. Chondrocalcinosis. Electronically Signed   By: Drusilla Kannerhomas  Dalessio M.D.   On: 12/10/2014 20:24      I have personally reviewed and evaluated these images and lab results as part of my medical decision-making.   MDM   Xrays.  Pt has ride, does not have to drive.  No meds pta.  Hydrocodone po.  Splint, long arm posterior.   Ortho/hand consulted.  Ice.   Discussed pt with Dr Merlyn LotKuzma - indicates splint, d/c, call office tomorrow for follow up.   Recheck radial pulse 2+. No numbness/weakness. Pain controlled.     Cathren LaineKevin Nykole Matos, MD 12/10/14  2038

## 2014-12-10 NOTE — ED Notes (Signed)
Per EMS: Pt was getting a mower off back of a truck, mower started to roll, went to try to stop it and tripped and fell.  Landed on lt forearm.  No deformity.  Pain on rotation.

## 2014-12-10 NOTE — Discharge Instructions (Signed)
It was our pleasure to provide your ER care today - we hope that you feel better.  Keep splint clean and dry.  Elevate arm/elbow, as much as possible, above the level of your heart.   Icepack/cold to sore area.  Take motrin or aleve as need for pain. You may also take hydrocodone as need for pain. No driving when taking hydrocodone. Also, do not take tylenol or acetaminophen containing medication when taking hydrocodone.  Follow up with orthopedic arm/hand specialist in the next few days - see referral - call their office tomorrow morning to arrange appointment.  Your blood pressure is high tonight - follow up with primary care doctor for recheck in the next 1-2 weeks.   Return to ER if worse, new symptoms, severe or intractable pain, numbness/weakness, other concern.  You were given pain medication in the ER - no driving for the next 4 hours.     Radial Head Fracture A radial head fracture is a break of the smaller bone (radius) in the forearm. The head of this bone is the part near the elbow. These fractures commonly happen during a fall, when you land on an outstretched arm. These fractures are more common in middle aged adults and are common with a dislocation of the elbow. SYMPTOMS   Swelling of the elbow joint and pain on the outside of the elbow.  Pain and difficulty in bending or straightening the elbow.  Pain and difficulty in turning the palm of the hand up or down with the elbow bent. DIAGNOSIS  Your caregiver may make this diagnosis by a physical exam. X-rays can confirm the type and amount of fracture. Sometimes a fracture that is not displaced cannot be seen on the original X-ray. TREATMENT  Radial head fractures are classified according to the amount of movement (displacement) of parts from the normal position.  Type 1 Fractures  Type 1 fractures are generally small fractures in which bone pieces remain together (nondisplaced fracture).  The fracture may not be  seen on initial X-rays. Usually if X-rays are repeated two to three weeks later, the fracture will show up. A splint or sling is used for a few days. Gentle early motion is used to prevent the elbow from becoming stiff. It should not be done vigorously or forced as this could displace the bone pieces. Type 2 Fractures  With type 2 fractures, bone pieces are slightly displaced and larger pieces of bone are broken off.  If only a little displacement of the bone piece is present, splinting for 4 to 5 days usually works well. This is again followed with gentle active range of motion. Small fragments may be surgically removed.  Large pieces of bone that can be put back into place will sometimes be fixed with pins or screws to hold them until the bone is healed. If this cannot be done, the fragments are removed. For older, less active people, sometimes the entire radial head is removed if the wrist is not injured. The elbow and arm will still work fine. Soft tissue, tendon, and ligament injuries are corrected at the same time. Type 3 Fractures  Type 3 fractures have multiple broken pieces of bone that cannot be fixed. Surgery is usually needed to remove the broken bits of bone and what is left of the radial head. Soft-tissue damage is repaired. Gentle early motion is used to prevent the elbow from becoming stiff. Sometimes an artificial radial head can be used to prevent deformity if the  elbow is unstable. Rest, ice, elevation, immobilization, medications, and pain control are used in the early care. HOME CARE INSTRUCTIONS   Keep the injured part elevated while sitting or lying down. Keep the injury above the level of your heart (the center of the chest). This will decrease swelling and pain.  Apply ice to the injury for 15-20 minutes, 03-04 times per day while awake, for 2 days. Put the ice in a plastic bag and place a towel between the bag of ice and your cast or splint.  Move your fingers to avoid  stiffness and minimize swelling.  If you have a plaster or fiberglass cast:  Do not try to scratch the skin under the cast using sharp or pointed objects.  Check the skin around the cast every day. You may put lotion on any red or sore areas.  Keep your cast dry and clean.  If you have a plaster splint:  Wear the splint as directed.  You may loosen the elastic around the splint if your fingers become numb, tingle, or turn cold or blue.  Do not put pressure on any part of your cast or splint. It may break. Rest your cast only on a pillow for the first 24 hours until it is fully hardened.  Your cast or splint can be protected during bathing with a plastic bag. Do not lower the cast or splint into the water.  Only take over-the-counter or prescription medicines for pain, discomfort, or fever as directed by your caregiver.  Follow all instructions for follow-up with your caregiver. This includes any orthopedic referrals, physical therapy, and rehabilitation. Any delay in obtaining necessary care could result in a delay or failure of the bones to heal or permanent elbow stiffness.  Do not overdo exercises. This could further damage your injury. SEEK IMMEDIATE MEDICAL CARE IF:   Your cast or splint gets damaged or breaks.  You have more severe pain or swelling than you did before getting the cast.  You have severe pain when stretching your fingers.  There is a bad smell, new stains, and/or pus-like (purulent) drainage coming from under the cast.  Your fingers or hand turn pale or blue, become cold, or you lose feeling.   This information is not intended to replace advice given to you by your health care provider. Make sure you discuss any questions you have with your health care provider.   Document Released: 12/02/2005 Document Revised: 03/03/2014 Document Reviewed: 08/23/2014 Elsevier Interactive Patient Education 2016 [employer]   Cast or Splint Care Casts and splints  support injured limbs and keep bones from moving while they heal. It is important to care for your cast or splint at home.  HOME CARE INSTRUCTIONS  Keep the cast or splint uncovered during the drying period. It can take 24 to 48 hours to dry if it is made of plaster. A fiberglass cast will dry in less than 1 hour.  Do not rest the cast on anything harder than a pillow for the first 24 hours.  Do not put weight on your injured limb or apply pressure to the cast until your health care provider gives you permission.  Keep the cast or splint dry. Wet casts or splints can lose their shape and may not support the limb as well. A wet cast that has lost its shape can also create harmful pressure on your skin when it dries. Also, wet skin can become infected.  Cover the cast or splint with a  plastic bag when bathing or when out in the rain or snow. If the cast is on the trunk of the body, take sponge baths until the cast is removed.  If your cast does become wet, dry it with a towel or a blow dryer on the cool setting only.  Keep your cast or splint clean. Soiled casts may be wiped with a moistened cloth.  Do not place any hard or soft foreign objects under your cast or splint, such as cotton, toilet paper, lotion, or powder.  Do not try to scratch the skin under the cast with any object. The object could get stuck inside the cast. Also, scratching could lead to an infection. If itching is a problem, use a blow dryer on a cool setting to relieve discomfort.  Do not trim or cut your cast or remove padding from inside of it.  Exercise all joints next to the injury that are not immobilized by the cast or splint. For example, if you have a long leg cast, exercise the hip joint and toes. If you have an arm cast or splint, exercise the shoulder, elbow, thumb, and fingers.  Elevate your injured arm or leg on 1 or 2 pillows for the first 1 to 3 days to decrease swelling and pain.It is best if you can  comfortably elevate your cast so it is higher than your heart. SEEK MEDICAL CARE IF:   Your cast or splint cracks.  Your cast or splint is too tight or too loose.  You have unbearable itching inside the cast.  Your cast becomes wet or develops a soft spot or area.  You have a bad smell coming from inside your cast.  You get an object stuck under your cast.  Your skin around the cast becomes red or raw.  You have new pain or worsening pain after the cast has been applied. SEEK IMMEDIATE MEDICAL CARE IF:   You have fluid leaking through the cast.  You are unable to move your fingers or toes.  You have discolored (blue or white), cool, painful, or very swollen fingers or toes beyond the cast.  You have tingling or numbness around the injured area.  You have severe pain or pressure under the cast.  You have any difficulty with your breathing or have shortness of breath.  You have chest pain.   This information is not intended to replace advice given to you by your health care provider. Make sure you discuss any questions you have with your health care provider.   Document Released: 02/08/2000 Document Revised: 12/01/2012 Document Reviewed: 08/19/2012 Elsevier Interactive Patient Education 2016 [employer] Inc[employer]     Cryotherapy Cryotherapy is when you put ice on your injury. Ice helps lessen pain and puffiness (swelling) after an injury. Ice works the best when you start using it in the first 24 to 48 hours after an injury. HOME CARE  Put a dry or damp towel between the ice pack and your skin.  You may press gently on the ice pack.  Leave the ice on for no more than 10 to 20 minutes at a time.  Check your skin after 5 minutes to make sure your skin is okay.  Rest at least 20 minutes between ice pack uses.  Stop using ice when your skin loses feeling (numbness).  Do not use ice on someone who cannot tell you when it hurts. This includes small children and people with  memory problems (dementia). GET HELP RIGHT AWAY  IF:  You have white spots on your skin.  Your skin turns blue or pale.  Your skin feels waxy or hard.  Your puffiness gets worse. MAKE SURE YOU:   Understand these instructions.  Will watch your condition.  Will get help right away if you are not doing well or get worse.   This information is not intended to replace advice given to you by your health care provider. Make sure you discuss any questions you have with your health care provider.   Hypertension Hypertension, commonly called high blood pressure, is when the force of blood pumping through your arteries is too strong. Your arteries are the blood vessels that carry blood from your heart throughout your body. A blood pressure reading consists of a higher number over a lower number, such as 110/72. The higher number (systolic) is the pressure inside your arteries when your heart pumps. The lower number (diastolic) is the pressure inside your arteries when your heart relaxes. Ideally you want your blood pressure below 120/80. Hypertension forces your heart to work harder to pump blood. Your arteries may become narrow or stiff. Having untreated or uncontrolled hypertension can cause heart attack, stroke, kidney disease, and other problems. RISK FACTORS Some risk factors for high blood pressure are controllable. Others are not.  Risk factors you cannot control include:   Race. You may be at higher risk if you are African American.  Age. Risk increases with age.  Gender. Men are at higher risk than women before age 45 years. After age 22, women are at higher risk than men. Risk factors you can control include:  Not getting enough exercise or physical activity.  Being overweight.  Getting too much fat, sugar, calories, or salt in your diet.  Drinking too much alcohol. SIGNS AND SYMPTOMS Hypertension does not usually cause signs or symptoms. Extremely high blood pressure  (hypertensive crisis) may cause headache, anxiety, shortness of breath, and nosebleed. DIAGNOSIS To check if you have hypertension, your health care provider will measure your blood pressure while you are seated, with your arm held at the level of your heart. It should be measured at least twice using the same arm. Certain conditions can cause a difference in blood pressure between your right and left arms. A blood pressure reading that is higher than normal on one occasion does not mean that you need treatment. If it is not clear whether you have high blood pressure, you may be asked to return on a different day to have your blood pressure checked again. Or, you may be asked to monitor your blood pressure at home for 1 or more weeks. TREATMENT Treating high blood pressure includes making lifestyle changes and possibly taking medicine. Living a healthy lifestyle can help lower high blood pressure. You may need to change some of your habits. Lifestyle changes may include:  Following the DASH diet. This diet is high in fruits, vegetables, and whole grains. It is low in salt, red meat, and added sugars.  Keep your sodium intake below 2,300 mg per day.  Getting at least 30-45 minutes of aerobic exercise at least 4 times per week.  Losing weight if necessary.  Not smoking.  Limiting alcoholic beverages.  Learning ways to reduce stress. Your health care provider may prescribe medicine if lifestyle changes are not enough to get your blood pressure under control, and if one of the following is true:  You are 19-52 years of age and your systolic blood pressure is above 140.  You are 55 years of age or older, and your systolic blood pressure is above 150.  Your diastolic blood pressure is above 90.  You have diabetes, and your systolic blood pressure is over 140 or your diastolic blood pressure is over 90.  You have kidney disease and your blood pressure is above 140/90.  You have heart disease  and your blood pressure is above 140/90. Your personal target blood pressure may vary depending on your medical conditions, your age, and other factors. HOME CARE INSTRUCTIONS  Have your blood pressure rechecked as directed by your health care provider.   Take medicines only as directed by your health care provider. Follow the directions carefully. Blood pressure medicines must be taken as prescribed. The medicine does not work as well when you skip doses. Skipping doses also puts you at risk for problems.  Do not smoke.   Monitor your blood pressure at home as directed by your health care provider. SEEK MEDICAL CARE IF:   You think you are having a reaction to medicines taken.  You have recurrent headaches or feel dizzy.  You have swelling in your ankles.  You have trouble with your vision. SEEK IMMEDIATE MEDICAL CARE IF:  You develop a severe headache or confusion.  You have unusual weakness, numbness, or feel faint.  You have severe chest or abdominal pain.  You vomit repeatedly.  You have trouble breathing. MAKE SURE YOU:   Understand these instructions.  Will watch your condition.  Will get help right away if you are not doing well or get worse.   This information is not intended to replace advice given to you by your health care provider. Make sure you discuss any questions you have with your health care provider.   Document Released: 02/10/2005 Document Revised: 06/27/2014 Document Reviewed: 12/03/2012 Elsevier Interactive Patient Education [employer]

## 2014-12-21 ENCOUNTER — Encounter (HOSPITAL_BASED_OUTPATIENT_CLINIC_OR_DEPARTMENT_OTHER): Payer: Self-pay | Admitting: *Deleted

## 2014-12-21 ENCOUNTER — Other Ambulatory Visit: Payer: Self-pay | Admitting: Orthopedic Surgery

## 2014-12-21 NOTE — Pre-Procedure Instructions (Signed)
To come for EKG 

## 2014-12-22 ENCOUNTER — Encounter (HOSPITAL_BASED_OUTPATIENT_CLINIC_OR_DEPARTMENT_OTHER)
Admission: RE | Admit: 2014-12-22 | Discharge: 2014-12-22 | Disposition: A | Discharge status: Home / Self Care | Payer: No Typology Code available for payment source | Source: Ambulatory Visit | Attending: Orthopedic Surgery | Admitting: Orthopedic Surgery

## 2014-12-22 ENCOUNTER — Other Ambulatory Visit: Payer: Self-pay

## 2014-12-22 DIAGNOSIS — Z01818 Encounter for other preprocedural examination: Secondary | ICD-10-CM | POA: Insufficient documentation

## 2014-12-25 NOTE — Anesthesia Preprocedure Evaluation (Addendum)
Anesthesia Evaluation  Patient identified by MRN, date of birth, ID band Patient awake    Reviewed: Allergy & Precautions, NPO status , Patient's Chart, lab work & pertinent test results  Airway Mallampati: I  TM Distance: >3 FB Neck ROM: Full    Dental  (+) Teeth Intact, Dental Advisory Given   Pulmonary    breath sounds clear to auscultation       Cardiovascular hypertension, Pt. on medications  Rhythm:Regular Rate:Normal     Neuro/Psych    GI/Hepatic GERD  Medicated and Controlled,  Endo/Other  Morbid obesity  Renal/GU      Musculoskeletal   Abdominal   Peds  Hematology   Anesthesia Other Findings Pt seen on 12/25/14 for questionable cough with sputum production.  On exam his lungs were clear and he was afebrile (98.3).  He has environmental allergies that cause this type of reaction periodically. I think he is able to have his planned procedure on 12/26/14.  Reproductive/Obstetrics                           Anesthesia Physical Anesthesia Plan  ASA: III  Anesthesia Plan: General and Regional   Post-op Pain Management:    Induction: Intravenous  Airway Management Planned: LMA  Additional Equipment:   Intra-op Plan:   Post-operative Plan: Extubation in OR  Informed Consent: I have reviewed the patients History and Physical, chart, labs and discussed the procedure including the risks, benefits and alternatives for the proposed anesthesia with the patient or authorized representative who has indicated his/her understanding and acceptance.   Dental advisory given  Plan Discussed with: CRNA, Anesthesiologist and Surgeon  Anesthesia Plan Comments:         Anesthesia Quick Evaluation

## 2014-12-26 ENCOUNTER — Ambulatory Visit (HOSPITAL_BASED_OUTPATIENT_CLINIC_OR_DEPARTMENT_OTHER)
Admission: RE | Admit: 2014-12-26 | Discharge: 2014-12-26 | Disposition: A | Discharge status: Home / Self Care | Payer: No Typology Code available for payment source | Source: Ambulatory Visit | Attending: Orthopedic Surgery | Admitting: Orthopedic Surgery

## 2014-12-26 ENCOUNTER — Encounter (HOSPITAL_BASED_OUTPATIENT_CLINIC_OR_DEPARTMENT_OTHER): Payer: Self-pay | Admitting: *Deleted

## 2014-12-26 ENCOUNTER — Ambulatory Visit (HOSPITAL_BASED_OUTPATIENT_CLINIC_OR_DEPARTMENT_OTHER): Payer: No Typology Code available for payment source | Admitting: Anesthesiology

## 2014-12-26 ENCOUNTER — Encounter (HOSPITAL_BASED_OUTPATIENT_CLINIC_OR_DEPARTMENT_OTHER)
Admission: RE | Disposition: A | Discharge status: Home / Self Care | Payer: Self-pay | Source: Ambulatory Visit | Attending: Orthopedic Surgery

## 2014-12-26 DIAGNOSIS — Z881 Allergy status to other antibiotic agents status: Secondary | ICD-10-CM | POA: Insufficient documentation

## 2014-12-26 DIAGNOSIS — S52042A Displaced fracture of coronoid process of left ulna, initial encounter for closed fracture: Secondary | ICD-10-CM | POA: Diagnosis not present

## 2014-12-26 DIAGNOSIS — M24232 Disorder of ligament, left wrist: Secondary | ICD-10-CM | POA: Insufficient documentation

## 2014-12-26 DIAGNOSIS — Z6841 Body Mass Index (BMI) 40.0 and over, adult: Secondary | ICD-10-CM | POA: Diagnosis not present

## 2014-12-26 DIAGNOSIS — K219 Gastro-esophageal reflux disease without esophagitis: Secondary | ICD-10-CM | POA: Diagnosis not present

## 2014-12-26 DIAGNOSIS — W208XXA Other cause of strike by thrown, projected or falling object, initial encounter: Secondary | ICD-10-CM | POA: Diagnosis not present

## 2014-12-26 DIAGNOSIS — S52122A Displaced fracture of head of left radius, initial encounter for closed fracture: Secondary | ICD-10-CM | POA: Diagnosis not present

## 2014-12-26 DIAGNOSIS — Y9289 Other specified places as the place of occurrence of the external cause: Secondary | ICD-10-CM | POA: Diagnosis not present

## 2014-12-26 DIAGNOSIS — I10 Essential (primary) hypertension: Secondary | ICD-10-CM | POA: Insufficient documentation

## 2014-12-26 DIAGNOSIS — Y9389 Activity, other specified: Secondary | ICD-10-CM | POA: Diagnosis not present

## 2014-12-26 DIAGNOSIS — Z888 Allergy status to other drugs, medicaments and biological substances status: Secondary | ICD-10-CM | POA: Diagnosis not present

## 2014-12-26 DIAGNOSIS — J45909 Unspecified asthma, uncomplicated: Secondary | ICD-10-CM | POA: Insufficient documentation

## 2014-12-26 DIAGNOSIS — Y998 Other external cause status: Secondary | ICD-10-CM | POA: Insufficient documentation

## 2014-12-26 HISTORY — DX: Other complications of anesthesia, initial encounter: T88.59XA

## 2014-12-26 HISTORY — DX: Tinnitus, bilateral: H93.13

## 2014-12-26 HISTORY — DX: Dental restoration status: Z98.811

## 2014-12-26 HISTORY — DX: Displaced fracture of head of left radius, initial encounter for closed fracture: S52.122A

## 2014-12-26 HISTORY — DX: Gastro-esophageal reflux disease without esophagitis: K21.9

## 2014-12-26 HISTORY — DX: Allergic rhinitis due to animal (cat) (dog) hair and dander: J30.81

## 2014-12-26 HISTORY — PX: ORIF ELBOW FRACTURE: SHX5031

## 2014-12-26 HISTORY — PX: RADIAL HEAD ARTHROPLASTY: SHX6044

## 2014-12-26 HISTORY — DX: Joint disorder, unspecified: M25.9

## 2014-12-26 HISTORY — DX: Adverse effect of unspecified anesthetic, initial encounter: T41.45XA

## 2014-12-26 HISTORY — DX: Other injury of unspecified body region, initial encounter: T14.8XXA

## 2014-12-26 HISTORY — DX: Other allergy status, other than to drugs and biological substances: Z91.09

## 2014-12-26 HISTORY — DX: Displaced fracture of coronoid process of left ulna, initial encounter for closed fracture: S52.042A

## 2014-12-26 HISTORY — PX: ULNAR COLLATERAL LIGAMENT REPAIR: SHX6159

## 2014-12-26 HISTORY — DX: Unspecified asthma, uncomplicated: J45.909

## 2014-12-26 SURGERY — ARTHROPLASTY, RADIUS, HEAD
Anesthesia: Regional | Site: Elbow | Laterality: Left

## 2014-12-26 MED ORDER — ROPIVACAINE HCL 5 MG/ML IJ SOLN
INTRAMUSCULAR | Status: DC | PRN
Start: 2014-12-26 — End: 2014-12-26
  Administered 2014-12-26: 10 mL via PERINEURAL

## 2014-12-26 MED ORDER — FENTANYL CITRATE (PF) 100 MCG/2ML IJ SOLN
INTRAMUSCULAR | Status: AC
  Filled 2014-12-26: qty 2

## 2014-12-26 MED ORDER — GLYCOPYRROLATE 0.2 MG/ML IJ SOLN: 0.2000 mg | Freq: Once | INTRAMUSCULAR | Status: DC | PRN

## 2014-12-26 MED ORDER — MEPERIDINE HCL 25 MG/ML IJ SOLN: 6.2500 mg | INTRAMUSCULAR | Status: DC | PRN

## 2014-12-26 MED ORDER — METOCLOPRAMIDE HCL 5 MG/ML IJ SOLN
INTRAMUSCULAR | Status: AC
  Filled 2014-12-26: qty 2

## 2014-12-26 MED ORDER — ONDANSETRON HCL 4 MG/2ML IJ SOLN
INTRAMUSCULAR | Status: AC
  Filled 2014-12-26: qty 2

## 2014-12-26 MED ORDER — LIDOCAINE HCL (CARDIAC) 20 MG/ML IV SOLN
INTRAVENOUS | Status: DC | PRN
  Administered 2014-12-26: 75 mg via INTRAVENOUS

## 2014-12-26 MED ORDER — OXYCODONE-ACETAMINOPHEN 5-325 MG PO TABS: ORAL_TABLET | ORAL | Status: DC

## 2014-12-26 MED ORDER — MIDAZOLAM HCL 2 MG/2ML IJ SOLN
1.0000 mg | INTRAMUSCULAR | Status: DC | PRN
  Administered 2014-12-26: 2 mg via INTRAVENOUS

## 2014-12-26 MED ORDER — DEXAMETHASONE SODIUM PHOSPHATE 4 MG/ML IJ SOLN
INTRAMUSCULAR | Status: DC | PRN
  Administered 2014-12-26: 10 mg via INTRAVENOUS

## 2014-12-26 MED ORDER — ONDANSETRON HCL 4 MG/2ML IJ SOLN
INTRAMUSCULAR | Status: DC | PRN
Start: 2014-12-26 — End: 2014-12-26
  Administered 2014-12-26: 4 mg via INTRAVENOUS

## 2014-12-26 MED ORDER — METOCLOPRAMIDE HCL 5 MG/5ML PO SOLN
10.0000 mg | Freq: Three times a day (TID) | ORAL | Status: DC
  Administered 2014-12-26: 10 mg via ORAL

## 2014-12-26 MED ORDER — SCOPOLAMINE 1 MG/3DAYS TD PT72: 1.0000 | MEDICATED_PATCH | Freq: Once | TRANSDERMAL | Status: DC | PRN

## 2014-12-26 MED ORDER — OXYCODONE HCL 5 MG PO TABS: 5.0000 mg | ORAL_TABLET | Freq: Once | ORAL | Status: DC | PRN

## 2014-12-26 MED ORDER — CEFAZOLIN SODIUM-DEXTROSE 2-3 GM-% IV SOLR
2.0000 g | INTRAVENOUS | Status: AC
  Administered 2014-12-26: 2 g via INTRAVENOUS

## 2014-12-26 MED ORDER — CHLORHEXIDINE GLUCONATE 4 % EX LIQD: 60.0000 mL | Freq: Once | CUTANEOUS | Status: DC

## 2014-12-26 MED ORDER — OXYCODONE HCL 5 MG/5ML PO SOLN: 5.0000 mg | Freq: Once | ORAL | Status: DC | PRN

## 2014-12-26 MED ORDER — FENTANYL CITRATE (PF) 100 MCG/2ML IJ SOLN
50.0000 ug | INTRAMUSCULAR | Status: DC | PRN
  Administered 2014-12-26: 100 ug via INTRAVENOUS

## 2014-12-26 MED ORDER — PROMETHAZINE HCL 25 MG/ML IJ SOLN
12.5000 mg | Freq: Once | INTRAMUSCULAR | Status: AC
  Administered 2014-12-26: 12.5 mg via INTRAVENOUS

## 2014-12-26 MED ORDER — LACTATED RINGERS IV SOLN
INTRAVENOUS | Status: DC
  Administered 2014-12-26 (×2): via INTRAVENOUS

## 2014-12-26 MED ORDER — DEXAMETHASONE SODIUM PHOSPHATE 10 MG/ML IJ SOLN
INTRAMUSCULAR | Status: AC
  Filled 2014-12-26: qty 1

## 2014-12-26 MED ORDER — PROPOFOL 10 MG/ML IV BOLUS
INTRAVENOUS | Status: DC | PRN
  Administered 2014-12-26: 250 mg via INTRAVENOUS

## 2014-12-26 MED ORDER — MIDAZOLAM HCL 2 MG/2ML IJ SOLN
INTRAMUSCULAR | Status: AC
  Filled 2014-12-26: qty 2

## 2014-12-26 MED ORDER — PROMETHAZINE HCL 25 MG/ML IJ SOLN
INTRAMUSCULAR | Status: AC
  Filled 2014-12-26: qty 1

## 2014-12-26 MED ORDER — HYDROMORPHONE HCL 1 MG/ML IJ SOLN: 0.2500 mg | INTRAMUSCULAR | Status: DC | PRN

## 2014-12-26 MED ORDER — CEFAZOLIN SODIUM-DEXTROSE 2-3 GM-% IV SOLR
INTRAVENOUS | Status: AC
  Filled 2014-12-26: qty 50

## 2014-12-26 MED ORDER — BUPIVACAINE-EPINEPHRINE (PF) 0.5% -1:200000 IJ SOLN
INTRAMUSCULAR | Status: DC | PRN
Start: 2014-12-26 — End: 2014-12-26
  Administered 2014-12-26: 30 mL via PERINEURAL

## 2014-12-26 SURGICAL SUPPLY — 73 items
ANCH SUT 2 SHRT 1.45 DRLBT (Orthopedic Implant) ×1 IMPLANT
ANCHOR JUGGERKNOT W/DRL 2/1.45 (Orthopedic Implant) ×2 IMPLANT
ARH slide loc head 26mm, left ×2 IMPLANT
BANDAGE ELASTIC 3 VELCRO ST LF (GAUZE/BANDAGES/DRESSINGS) ×4 IMPLANT
BANDAGE ELASTIC 4 VELCRO ST LF (GAUZE/BANDAGES/DRESSINGS) ×3 IMPLANT
BLADE AVERAGE 25MMX9MM (BLADE) ×1
BLADE AVERAGE 25X9 (BLADE) ×2 IMPLANT
BLADE SURG 15 STRL LF DISP TIS (BLADE) ×2 IMPLANT
BLADE SURG 15 STRL SS (BLADE) ×6
BNDG CMPR 9X4 STRL LF SNTH (GAUZE/BANDAGES/DRESSINGS) ×1
BNDG ESMARK 4X9 LF (GAUZE/BANDAGES/DRESSINGS) ×3 IMPLANT
BNDG GAUZE ELAST 4 BULKY (GAUZE/BANDAGES/DRESSINGS) ×4 IMPLANT
CANISTER SUCT 1200ML W/VALVE (MISCELLANEOUS) ×3 IMPLANT
CHLORAPREP W/TINT 26ML (MISCELLANEOUS) ×3 IMPLANT
CORDS BIPOLAR (ELECTRODE) ×3 IMPLANT
COVER BACK TABLE 60X90IN (DRAPES) ×3 IMPLANT
COVER MAYO STAND STRL (DRAPES) ×3 IMPLANT
CUFF TOURNIQUET SINGLE 24IN (TOURNIQUET CUFF) ×2 IMPLANT
DRAPE EXTREMITY T 121X128X90 (DRAPE) ×3 IMPLANT
DRAPE OEC MINIVIEW 54X84 (DRAPES) ×3 IMPLANT
DRAPE SURG 17X23 STRL (DRAPES) ×3 IMPLANT
DRSG PAD ABDOMINAL 8X10 ST (GAUZE/BANDAGES/DRESSINGS) ×3 IMPLANT
GAUZE SPONGE 4X4 12PLY STRL (GAUZE/BANDAGES/DRESSINGS) ×3 IMPLANT
GAUZE XEROFORM 1X8 LF (GAUZE/BANDAGES/DRESSINGS) ×3 IMPLANT
GLOVE BIO SURGEON STRL SZ7.5 (GLOVE) ×5 IMPLANT
GLOVE BIOGEL PI IND STRL 8 (GLOVE) ×1 IMPLANT
GLOVE BIOGEL PI IND STRL 8.5 (GLOVE) ×1 IMPLANT
GLOVE BIOGEL PI INDICATOR 8 (GLOVE) ×2
GLOVE BIOGEL PI INDICATOR 8.5 (GLOVE) ×2
GLOVE SURG ORTHO 8.0 STRL STRW (GLOVE) ×3 IMPLANT
GLOVE SURG SS PI 8.5 STRL IVOR (GLOVE) ×2
GLOVE SURG SS PI 8.5 STRL STRW (GLOVE) ×1 IMPLANT
GOWN STRL REUS W/ TWL LRG LVL3 (GOWN DISPOSABLE) ×1 IMPLANT
GOWN STRL REUS W/ TWL XL LVL3 (GOWN DISPOSABLE) ×2 IMPLANT
GOWN STRL REUS W/TWL LRG LVL3 (GOWN DISPOSABLE) ×3
GOWN STRL REUS W/TWL XL LVL3 (GOWN DISPOSABLE) ×6 IMPLANT
K-WIRE SGLE END .054 LG (WIRE) ×6
KWIRE SGLE END .054 LG (WIRE) IMPLANT
NS IRRIG 1000ML POUR BTL (IV SOLUTION) ×3 IMPLANT
PACK BASIN DAY SURGERY FS (CUSTOM PROCEDURE TRAY) ×3 IMPLANT
PAD CAST 4YDX4 CTTN HI CHSV (CAST SUPPLIES) ×1 IMPLANT
PADDING CAST ABS 4INX4YD NS (CAST SUPPLIES) ×2
PADDING CAST ABS COTTON 4X4 ST (CAST SUPPLIES) ×1 IMPLANT
PADDING CAST COTTON 4X4 STRL (CAST SUPPLIES) ×3
SLEEVE SCD COMPRESS KNEE MED (MISCELLANEOUS) ×3 IMPLANT
SLIDE-LOC ARH NECK 1MM (Miscellaneous) ×2 IMPLANT
SLING ARM FOAM STRAP XLG (SOFTGOODS) ×2 IMPLANT
SPLINT FAST PLASTER 5X30 (CAST SUPPLIES) ×20
SPLINT PLASTER CAST FAST 5X30 (CAST SUPPLIES) IMPLANT
SPLINT PLASTER CAST XFAST 3X15 (CAST SUPPLIES) ×30 IMPLANT
SPLINT PLASTER XTRA FASTSET 3X (CAST SUPPLIES) ×20
STAPLER VISISTAT 35W (STAPLE) ×2 IMPLANT
STOCKINETTE 4X48 STRL (DRAPES) ×1 IMPLANT
STOCKINETTE 6  STRL (DRAPES) ×2
STOCKINETTE 6 STRL (DRAPES) IMPLANT
SUCTION FRAZIER TIP 10 FR DISP (SUCTIONS) ×2 IMPLANT
SUT ETHILON 4 0 PS 2 18 (SUTURE) ×3 IMPLANT
SUT FIBERWIRE #2 38 T-5 BLUE (SUTURE) ×3
SUT MNCRL AB 4-0 PS2 18 (SUTURE) ×3 IMPLANT
SUT STEEL 4 0 (SUTURE) ×2 IMPLANT
SUT VIC AB 2-0 SH 27 (SUTURE) ×3
SUT VIC AB 2-0 SH 27XBRD (SUTURE) IMPLANT
SUT VIC AB 3-0 PS1 18 (SUTURE) ×3
SUT VIC AB 3-0 PS1 18XBRD (SUTURE) IMPLANT
SUT VICRYL 4-0 PS2 18IN ABS (SUTURE) ×3 IMPLANT
SUTURE FIBERWR #2 38 T-5 BLUE (SUTURE) IMPLANT
SYR BULB 3OZ (MISCELLANEOUS) ×3 IMPLANT
TOWEL OR 17X24 6PK STRL BLUE (TOWEL DISPOSABLE) ×6 IMPLANT
TUBE ANAEROBIC SPECIMEN COL (MISCELLANEOUS) IMPLANT
TUBE CONNECTING 20'X1/4 (TUBING) ×1
TUBE CONNECTING 20X1/4 (TUBING) ×1 IMPLANT
UNDERPAD 30X30 (UNDERPADS AND DIAPERS) ×3 IMPLANT
arh slide loc standard stem 10mm ×2 IMPLANT

## 2014-12-26 NOTE — Progress Notes (Signed)
Assisted Dr. Crews with left, ultrasound guided, infraclavicular block. Side rails up, monitors on throughout procedure. See vital signs in flow sheet. Tolerated Procedure well. 

## 2014-12-26 NOTE — H&P (Signed)
Carl Graves is an 49 y.o. male.   Chief Complaint: left radial head and coronoid fractures HPI: 49 yo rhd male states he fell from standing height onto left arm trying to move away from lawn mower falling from truck.  [facility] revealed left radial head and coronoid fractures.  Splinted and followed up in office.  He reports no previous injury to left elbow.  Past Medical History  Diagnosis Date  . Tinnitus of both ears   . Acid reflux     occasional - no current med.  . Hypertension     has been on med. x 10 yr.; not completely controlled, per pt.  . Reactive airway disease     daily and prn inhalers  . Left radial head fracture 12/10/2014  . Fracture of coronoid process of left ulna 12/10/2014  . Ligament tear 12/10/2014    lateral collateral ligament tear elbow  . Cat allergies   . Allergic to dogs   . Pollen allergy   . Dental crown present   . Joint disorder     states jaw locks if opens mouth wide  . Complication of anesthesia     states is hard to wake up post-op    Past Surgical History  Procedure Laterality Date  . Carpal tunnel release Right 07/11/2009  . Carpal tunnel release Left 08/15/2009  . Shoulder arthroscopy Right   . Ankle surgery Right age 49    repair tendons and ligaments    History reviewed. No pertinent family history. Social History:  reports that he has never smoked. He has never used smokeless tobacco. He reports that he drinks alcohol. He reports that he does not use illicit drugs.  Allergies:  Allergies  Allergen Reactions  . Amlodipine Other (See Comments)    SWELLING OF LEGS  . Clarithromycin Nausea And Vomiting    Medications Prior to Admission  Medication Sig Dispense Refill  . albuterol (PROVENTIL HFA;VENTOLIN HFA) 108 (90 BASE) MCG/ACT inhaler Inhale 1-2 puffs into the lungs every 6 (six) hours as needed for wheezing or shortness of breath.    . ALPRAZolam (XANAX) 0.25 MG tablet Take 0.25 mg by mouth at bedtime as  needed for anxiety.    Marland Kitchen. aspirin 81 MG tablet Take 81 mg by mouth daily.    . benazepril (LOTENSIN) 20 MG tablet Take 20 mg by mouth daily.    . budesonide-formoterol (SYMBICORT) 160-4.5 MCG/ACT inhaler Inhale 2 puffs into the lungs 2 (two) times daily.    Marland Kitchen. HYDROcodone-acetaminophen (NORCO/VICODIN) 5-325 MG tablet Take 1-2 tablets by mouth every 6 (six) hours as needed for moderate pain. 30 tablet 0  . ibuprofen (ADVIL,MOTRIN) 200 MG tablet Take 200 mg by mouth every 6 (six) hours as needed.    . loratadine (CLARITIN) 10 MG tablet Take 10 mg by mouth daily.      . montelukast (SINGULAIR) 10 MG tablet Take 10 mg by mouth at bedtime.      . Omega-3 Fatty Acids (FISH OIL PO) Take 1,200 mg by mouth daily.       No results found for this or any previous visit (from the past 48 hour(s)).  No results found.   A comprehensive review of systems was negative except for: Eyes: positive for contacts/glasses Ears, nose, mouth, throat, and face: positive for tinnitus Respiratory: positive for asthma Musculoskeletal: positive for prior fracture  Blood pressure 126/70, pulse 83, temperature 98.3 F (36.8 C), temperature source Oral, resp. rate 15,  height  (1.727 m), weight 122.471 kg (270 lb), SpO2 99 %.  General appearance: alert, cooperative and appears stated age Head: Normocephalic, without obvious abnormality, atraumatic Neck: supple, symmetrical, trachea midline Resp: clear to auscultation bilaterally Cardio: regular rate and rhythm GI: non tender Extremities: intact sensation and capillary refill all digits.  +epl/fpl/io.  no wounds. Pulses: 2+ and symmetric Skin: Skin color, texture, turgor normal. No rashes or lesions Neurologic: Grossly normal Incision/Wound: none  Assessment/Plan Left radial head and coronoid fractures possible lateral ulnar collateral ligament injury.  Recommend OR for radial head arthroplasty, coronoid fixation, LUCL repair as necessary.  Risks, benefits, and  alternatives of surgery were discussed and the patient agrees with the plan of care.   Kiarra Kidd R 12/26/2014, 2:55 PM

## 2014-12-26 NOTE — Op Note (Signed)
Intra-operative fluoroscopic images in the AP, lateral, and oblique views were taken and evaluated by myself.  Reduction and hardware placement were confirmed.  There was no intraarticular penetration of permanent hardware.  

## 2014-12-26 NOTE — Anesthesia Procedure Notes (Addendum)
Anesthesia Regional Block:  Infraclavicular brachial plexus block  Pre-Anesthetic Checklist: ,, timeout performed, Correct Patient, Correct Site, Correct Laterality, Correct Procedure, Correct Position, site marked, Risks and benefits discussed,  Surgical consent,  Pre-op evaluation,  At surgeon's request and post-op pain management  Laterality: Left and Upper  Prep: chloraprep       Needles:  Injection technique: Single-shot  Needle Type: Echogenic Stimulator Needle     Needle Length: 5cm 5 cm Needle Gauge: 21 and 21 G    Additional Needles:  Procedures: ultrasound guided (picture in chart) Infraclavicular brachial plexus block Narrative:  Start time: 12/26/2014 2:03 PM End time: 12/26/2014 2:08 PM Injection made incrementally with aspirations every 5 mL.  Performed by: Personally  Anesthesiologist: CREWS, Dacoda   Procedure Name: LMA Insertion Date/Time: 12/26/2014 3:12 PM Performed by: Gar GibbonKEETON, Charlita Brian S Pre-anesthesia Checklist: Patient identified, Emergency Drugs available, Suction available and Patient being monitored Patient Re-evaluated:Patient Re-evaluated prior to inductionOxygen Delivery Method: Circle System Utilized Preoxygenation: Pre-oxygenation with 100% oxygen Intubation Type: IV induction Ventilation: Mask ventilation without difficulty LMA: LMA inserted LMA Size: 5.0 Number of attempts: 1 Airway Equipment and Method: Bite block Placement Confirmation: positive ETCO2 Tube secured with: Tape Dental Injury: Teeth and Oropharynx as per pre-operative assessment       Left infraclavicular block image

## 2014-12-26 NOTE — Discharge Instructions (Addendum)

## 2014-12-26 NOTE — Brief Op Note (Signed)
12/26/2014  5:01 PM  PATIENT:  Carl Graves  49 y.o. male  PRE-OPERATIVE DIAGNOSIS:  LEFT RADIAL HEAD FRACTURE CORONOID FRACTURE LATERAL COLLATERAL LIGAMENT TEAR   POST-OPERATIVE DIAGNOSIS:  LEFT RADIAL HEAD FRACTURE CORONOID FRACTURE LATERAL COLLATERAL LIGAMENT TEAR   PROCEDURE:  Procedure(s): LEFT RADIAL HEAD ARTHROPLASTY (Left) CORONOID OPEN REDUCTION INTERNAL FIXATION (ORIF)  (Left) REPAIR LATERAL COLLATERAL LIGAMENT  (Left)  SURGEON:  Surgeon(s) and Role:    * Betha LoaKevin Gennaro Lizotte, MD - Primary    * Cindee SaltGary Daleisa Halperin, MD - Assisting  PHYSICIAN ASSISTANT:   ASSISTANTS: Cindee SaltGary Naethan Bracewell, MD   ANESTHESIA:   regional and general  EBL:  Total I/O In: 1000 [I.V.:1000] Out: -   BLOOD ADMINISTERED:none  DRAINS: none   LOCAL MEDICATIONS USED:  NONE  SPECIMEN:  No Specimen  DISPOSITION OF SPECIMEN:  N/A  COUNTS:  YES  TOURNIQUET:   Total Tourniquet Time Documented: Upper Arm (Left) - 90 minutes Total: Upper Arm (Left) - 90 minutes   DICTATION: .Other Dictation: Dictation Number [id number]  PLAN OF CARE: Discharge to home after PACU  PATIENT DISPOSITION:  PACU - hemodynamically stable.

## 2014-12-26 NOTE — Op Note (Signed)
[id number] 

## 2014-12-26 NOTE — Transfer of Care (Signed)
Immediate Anesthesia Transfer of Care Note  Patient: Carl Graves  Procedure(s) Performed: Procedure(s): LEFT RADIAL HEAD ARTHROPLASTY (Left) CORONOID OPEN REDUCTION INTERNAL FIXATION (ORIF)  (Left) REPAIR LATERAL COLLATERAL LIGAMENT  (Left)  Patient Location: PACU  Anesthesia Type:GA combined with regional for post-op pain  Level of Consciousness: awake, alert  and oriented  Airway & Oxygen Therapy: Patient Spontanous Breathing and Patient connected to face mask oxygen  Post-op Assessment: Report given to RN and Post -op Vital signs reviewed and stable  Post vital signs: Reviewed and stable  Last Vitals:  Filed Vitals:   12/26/14 1709  BP:   Pulse: 98  Temp:   Resp: 13    Complications: No apparent anesthesia complications

## 2014-12-27 NOTE — Op Note (Signed)
NAMERIVEN, MABILE NO.:  [id number]  MEDICAL RECORD NO.:  [id number]  LOCATION:                                 FACILITY:  PHYSICIAN:  Betha Loa, MD        DATE OF BIRTH:  May 09, 1965  DATE OF PROCEDURE:  12/26/2014 DATE OF DISCHARGE:                              OPERATIVE REPORT   PREOPERATIVE DIAGNOSES:  Left comminuted radial head fracture, coronoid fracture, and lateral ulnar collateral ligament injury.  POSTOPERATIVE DIAGNOSES:  Left comminuted radial head fracture, coronoid fracture, and lateral ulnar collateral ligament injury.  PROCEDURES:   1. Left radial head arthroplasty 2. Left coronoid open reduction and internal fixation 3. Left lateral ulnar collateral ligament repair.  SURGEON:  Betha Loa, MD.  ASSISTANT:  Cindee Salt, MD.  ANESTHESIA:  General with regional.  IV FLUIDS:  Per anesthesia flow sheet.  ESTIMATED BLOOD LOSS:  Minimal.  COMPLICATIONS:  None.  SPECIMENS:  None.  TOURNIQUET TIME:  90 minutes.  DISPOSITION:  Stable to PACU.  INDICATIONS:  Mr. Diekman is a 49 year old male who fell approximately a week-and-a-half ago injuring his left elbow.  He was seen at [facility] where radiographs have been taken revealing a comminuted radial head fracture with coronoid fracture.  He followed up in the office.  I recommended radial head arthroplasty with coronoid fixation and repair of lateral ulnar collateral ligament as necessary.  Risks, benefits, and alternatives of the surgery were discussed including risk of blood loss; infection; damage to nerves, vessels, tendons, ligaments, bone; failure of surgery; need for additional surgery; complications with wound healing, continued pain, nonunion, malunion, stiffness.  He voiced understanding of these risks and elected to proceed.  OPERATIVE COURSE:  After being identified preoperatively by myself, the patient and I agreed upon procedure and site of procedure.   Surgical site was marked.  Risks, benefits, and alternatives of surgery were reviewed, and he wished to proceed.  Surgical consent had been signed. He was given IV Ancef as preoperative antibiotic prophylaxis.  He was transferred to the operating room and placed on the operating room table in supine position with the left upper extremity on arm board.  General anesthesia was induced by Anesthesiology.  A regional block had been performed by Anesthesia in preoperative holding.  Left upper extremity was prepped and draped in normal sterile orthopedic fashion.  A surgical pause was performed between surgeons, Anesthesia, and operating room staff, and all were in agreement as to the patient, procedure, and site of procedure.  Tourniquet at the proximal aspect of the extremity was inflated to 250 mmHg after exsanguination of the limb with an Esmarch bandage.  An incision was made at the lateral side of the elbow and carried into subcutaneous tissues by spreading technique.  Bipolar electrocautery was used to obtain hemostasis.  Interval between the [facility] and anconeus muscles was performed.  This was carried down to the radial head.  The fragments were palpable.  The capsule was opened.  There was blood in the joint.  The radial head fragments were removed.  One of them was impacted.  There were more than three fragments.  The joint was cleared of hematoma and irrigated.  The coronoid fracture was visible. An incision was made at the posterior aspect of the elbow over the proximal ulna.  Two guide pins were advanced from the dorsal side of the ulna and out through the coronoid fragment.  A #2 FiberWire suture was able to be passed through these holes and looped over the coronoid to bring it down into reduction.  This was tagged for tightening later. The radial head was then addressed.  The radial neck was freshened.  A collar reamer was used to smooth it out.  The canal was entered, and a broach  was used to broach up to a 10 mm stem size.  The trial components were placed.  This was a 10 mm +1 stem with a 26 mm head.  C-arm was used in AP and lateral projections to ensure appropriate size of the component which appeared to be the case.  The forearm was placed through pronation supination, and full pronation and supination was achieved. There was no click.  The trial components were removed.  The joint was copiously irrigated with sterile saline.  The arthroplasty components were inserted.  The forearm was placed through a range of motion.  Good pronation and supination was achieved.  The C-arm was used in AP and lateral projections to ensure appropriate position of the hardware which was the case.  The coronoid fragment was reduced.  The suture ends were tied over the ulna.  This provided good tight reduction.  The lateral collateral ligament had been partially avulsed from the insertion at the lateral epicondyle.  A JuggerKnot suture was placed in this area, and I used to reapproximate the origin of the lateral ulnar collateral ligament.  The interval between the muscles was then repaired with the tails of the FiberWire sutures from the JuggerKnot anchor.  The subcutaneous tissues were closed with 2-0 Vicryl suture in an interrupted fashion.  This was augmented with a 4-0 Vicryl suture as well and both wounds.  The skin was closed with staples.  Wounds were dressed with sterile Xeroform, 4x4s, and ABD, and wrapped with a Kerlix dressing.  A posterior splint with sidebar was placed with the elbow in 90 degrees of flexion and the forearm in neutral.  This was wrapped with Kerlix and Ace bandage.  Tourniquet was deflated at 90 minutes. Fingertips were pink with brisk capillary refill after deflation of the tourniquet.  The operative drapes were broken down.  The patient was awoken from anesthesia safely.  He was transferred back to a stretcher and taken to PACU in stable condition.   I will see him back in the office in one week for postoperative followup.  I will give him Percocet 5/325, 1-2 p.o. q.6 hours p.r.n. pain, dispensed #40.     Betha LoaKevin Nayquan Evinger, MD     KK/MEDQ  D:  12/26/2014  T:  12/27/2014  Job:  [id number]

## 2014-12-27 NOTE — Anesthesia Postprocedure Evaluation (Signed)
  Anesthesia Post-op Note  Patient: Carl Graves  Procedure(s) Performed: Procedure(s): LEFT RADIAL HEAD ARTHROPLASTY (Left) CORONOID OPEN REDUCTION INTERNAL FIXATION (ORIF)  (Left) REPAIR LATERAL COLLATERAL LIGAMENT  (Left)  Patient Location: PACU  Anesthesia Type:General and GA combined with regional for post-op pain  Level of Consciousness: awake, alert  and oriented  Airway and Oxygen Therapy: Patient Spontanous Breathing  Post-op Pain: none  Post-op Assessment: Post-op Vital signs reviewed, Patient's Cardiovascular Status Stable, Respiratory Function Stable, Patent Airway and Pain level controlled              Post-op Vital Signs: stable  Last Vitals:  Filed Vitals:   12/26/14 1819  BP: 133/89  Pulse: 89  Temp: 37 C  Resp: 18    Complications: No apparent anesthesia complications

## 2014-12-28 ENCOUNTER — Encounter (HOSPITAL_BASED_OUTPATIENT_CLINIC_OR_DEPARTMENT_OTHER): Payer: Self-pay | Admitting: Orthopedic Surgery

## 2015-10-29 ENCOUNTER — Encounter (HOSPITAL_COMMUNITY): Payer: Self-pay | Admitting: *Deleted

## 2015-10-29 ENCOUNTER — Ambulatory Visit (HOSPITAL_COMMUNITY)
Admission: EM | Admit: 2015-10-29 | Discharge: 2015-10-29 | Disposition: A | Discharge status: Home / Self Care | Payer: No Typology Code available for payment source | Attending: Family Medicine | Admitting: Family Medicine

## 2015-10-29 DIAGNOSIS — S161XXA Strain of muscle, fascia and tendon at neck level, initial encounter: Secondary | ICD-10-CM | POA: Diagnosis not present

## 2015-10-29 DIAGNOSIS — S39012A Strain of muscle, fascia and tendon of lower back, initial encounter: Secondary | ICD-10-CM | POA: Diagnosis not present

## 2015-10-29 NOTE — Discharge Instructions (Signed)
Heat and stretches frequently during the day. No exercising, lifting or pulling for the next several days until no pain or discomfort of the neck or back. Continue your meloxicam as directed.
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
## 2016-01-24 ENCOUNTER — Other Ambulatory Visit: Payer: Self-pay | Admitting: Gastroenterology

## 2016-02-26 ENCOUNTER — Encounter (HOSPITAL_COMMUNITY): Payer: Self-pay | Admitting: *Deleted

## 2016-03-03 ENCOUNTER — Ambulatory Visit (HOSPITAL_COMMUNITY): Payer: No Typology Code available for payment source | Admitting: Certified Registered Nurse Anesthetist

## 2016-03-03 ENCOUNTER — Encounter (HOSPITAL_COMMUNITY): Payer: Self-pay | Admitting: *Deleted

## 2016-03-03 ENCOUNTER — Encounter (HOSPITAL_COMMUNITY)
Admission: RE | Disposition: A | Discharge status: Home / Self Care | Payer: Self-pay | Source: Ambulatory Visit | Attending: Gastroenterology

## 2016-03-03 ENCOUNTER — Ambulatory Visit (HOSPITAL_COMMUNITY)
Admission: RE | Admit: 2016-03-03 | Discharge: 2016-03-03 | Disposition: A | Discharge status: Home / Self Care | Payer: No Typology Code available for payment source | Source: Ambulatory Visit | Attending: Gastroenterology | Admitting: Gastroenterology

## 2016-03-03 DIAGNOSIS — Z79899 Other long term (current) drug therapy: Secondary | ICD-10-CM | POA: Diagnosis not present

## 2016-03-03 DIAGNOSIS — Z1211 Encounter for screening for malignant neoplasm of colon: Secondary | ICD-10-CM | POA: Insufficient documentation

## 2016-03-03 DIAGNOSIS — J45909 Unspecified asthma, uncomplicated: Secondary | ICD-10-CM | POA: Diagnosis not present

## 2016-03-03 DIAGNOSIS — K219 Gastro-esophageal reflux disease without esophagitis: Secondary | ICD-10-CM | POA: Insufficient documentation

## 2016-03-03 DIAGNOSIS — I10 Essential (primary) hypertension: Secondary | ICD-10-CM | POA: Insufficient documentation

## 2016-03-03 HISTORY — PX: COLONOSCOPY WITH PROPOFOL: SHX5780

## 2016-03-03 HISTORY — DX: Unspecified asthma, uncomplicated: J45.909

## 2016-03-03 SURGERY — COLONOSCOPY WITH PROPOFOL
Anesthesia: Monitor Anesthesia Care

## 2016-03-03 MED ORDER — PROPOFOL 10 MG/ML IV BOLUS
INTRAVENOUS | Status: AC
  Filled 2016-03-03: qty 40

## 2016-03-03 MED ORDER — LIDOCAINE 2% (20 MG/ML) 5 ML SYRINGE
INTRAMUSCULAR | Status: DC | PRN
  Administered 2016-03-03: 100 mg via INTRAVENOUS

## 2016-03-03 MED ORDER — PROPOFOL 10 MG/ML IV BOLUS
INTRAVENOUS | Status: DC | PRN
  Administered 2016-03-03: 20 mg via INTRAVENOUS

## 2016-03-03 MED ORDER — ONDANSETRON HCL 4 MG/2ML IJ SOLN
INTRAMUSCULAR | Status: DC | PRN
  Administered 2016-03-03: 4 mg via INTRAVENOUS

## 2016-03-03 MED ORDER — LACTATED RINGERS IV SOLN
INTRAVENOUS | Status: DC
  Administered 2016-03-03: 09:00:00 via INTRAVENOUS

## 2016-03-03 MED ORDER — LIDOCAINE 2% (20 MG/ML) 5 ML SYRINGE
INTRAMUSCULAR | Status: AC
  Filled 2016-03-03: qty 5

## 2016-03-03 MED ORDER — PROPOFOL 500 MG/50ML IV EMUL
INTRAVENOUS | Status: DC | PRN
  Administered 2016-03-03: 200 ug/kg/min via INTRAVENOUS

## 2016-03-03 MED ORDER — ONDANSETRON HCL 4 MG/2ML IJ SOLN
INTRAMUSCULAR | Status: AC
  Filled 2016-03-03: qty 2

## 2016-03-03 MED ORDER — SODIUM CHLORIDE 0.9 % IV SOLN: INTRAVENOUS | Status: DC

## 2016-03-03 SURGICAL SUPPLY — 22 items

## 2016-03-03 NOTE — Addendum Note (Signed)
Addendum  created 03/03/16 1123 by Lewie LoronJohn Shaune Westfall, MD   Sign clinical note

## 2016-03-03 NOTE — Transfer of Care (Signed)
Immediate Anesthesia Transfer of Care Note  Patient: Carl Graves  Procedure(s) Performed: Procedure(s): COLONOSCOPY WITH PROPOFOL (N/A)  Patient Location: PACU  Anesthesia Type:MAC  Level of Consciousness:  sedated, patient cooperative and responds to stimulation  Airway & Oxygen Therapy:Patient Spontanous Breathing and Patient connected to face mask oxgen  Post-op Assessment:  Report given to PACU RN and Post -op Vital signs reviewed and stable  Post vital signs:  Reviewed and stable  Last Vitals:  Vitals:   03/03/16 0951 03/03/16 0953  BP: (!) 145/101   Pulse: 96 95  Resp: (!) 21 20  Temp: 69.7 C     Complications: No apparent anesthesia complications

## 2016-03-03 NOTE — Op Note (Signed)
[facility] Patient Name: Carl Graves Procedure Date: 03/03/2016 MRN: [id number] Attending MD: Charolett Bumpers , MD Date of Birth: February 13, 1966 CSN: [id number] Age: 51 Admit Type: Outpatient Procedure:                Colonoscopy Indications:              Screening for colorectal malignant neoplasm Providers:                Charolett Bumpers, MD, Roselie Awkward, RN, Lorenda Ishihara, Technician, Maricela Curet, CRNA Referring MD:              Medicines:                Propofol per Anesthesia Complications:            No immediate complications. Estimated Blood Loss:     Estimated blood loss: none. Procedure:                Pre-Anesthesia Assessment:                           - Prior to the procedure, a History and Physical                            was performed, and patient medications and                            allergies were reviewed. The patient's tolerance of                            previous anesthesia was also reviewed. The risks                            and benefits of the procedure and the sedation                            options and risks were discussed with the patient.                            All questions were answered, and informed consent                            was obtained. Prior Anticoagulants: The patient has                            taken aspirin, last dose was 7 days prior to                            procedure. ASA Grade Assessment: II - A patient                            with mild systemic disease. After reviewing the  risks and benefits, the patient was deemed in                            satisfactory condition to undergo the procedure.                           After obtaining informed consent, the colonoscope                            was passed under direct vision. Throughout the                            procedure, the patient's blood pressure, pulse, and         oxygen saturations were monitored continuously. The                            EC-[id number] ([id number]) scope was introduced through                            the anus and advanced to the the cecum, identified                            by appendiceal orifice and ileocecal valve. The                            colonoscopy was performed without difficulty. The                            patient tolerated the procedure well. The quality                            of the bowel preparation was good. The terminal                            ileum, the ileocecal valve, the appendiceal orifice                            and the rectum were photographed. Scope In: 9:30:13 AM Scope Out: 9:43:51 AM Scope Withdrawal Time: 0 hours 8 minutes 48 seconds  Total Procedure Duration: 0 hours 13 minutes 38 seconds  Findings:      The perianal and digital rectal examinations were normal.      The entire examined colon appeared normal. Left colonic diverticulosis       was present. Impression:               - The entire examined colon is normal.                           - No specimens collected. Moderate Sedation:      N/A- Per Anesthesia Care Recommendation:           - Patient has a contact number available for                            emergencies.  The signs and symptoms of potential                            delayed complications were discussed with the                            patient. Return to normal activities tomorrow.                            Written discharge instructions were provided to the                            patient.                           - Repeat colonoscopy in 10 years for screening                            purposes.                           - Resume previous diet.                           - Continue present medications. Procedure Code(s):        --- Professional ---                           M5784G0121, Colorectal cancer screening; colonoscopy on                             individual not meeting criteria for high risk Diagnosis Code(s):        --- Professional ---                           Z12.11, Encounter for screening for malignant                            neoplasm of colon CPT copyright 2016 [employer]. All rights reserved. The codes documented in this report are preliminary and upon coder review may  be revised to meet current compliance requirements. Danise EdgeMartin Johnson, MD Charolett BumpersMartin K Johnson, MD 03/03/2016 9:50:09 AM This report has been signed electronically. Number of Addenda: 0

## 2016-03-03 NOTE — Anesthesia Postprocedure Evaluation (Addendum)
Anesthesia Post Note  Patient: Carl Graves  Procedure(s) Performed: Procedure(s) (LRB): COLONOSCOPY WITH PROPOFOL (N/A)  Patient location during evaluation: PACU Anesthesia Type: MAC Level of consciousness: awake and alert Pain management: pain level controlled Vital Signs Assessment: post-procedure vital signs reviewed and stable Respiratory status: spontaneous breathing Cardiovascular status: stable Anesthetic complications: no       Last Vitals:  Vitals:   03/03/16 1005 03/03/16 1010  BP:  123/79  Pulse: 85 84  Resp: 17 13  Temp:      Last Pain:  Vitals:   03/03/16 0951  TempSrc: Oral                 Nolon Nations

## 2016-03-03 NOTE — Discharge Instructions (Signed)

## 2016-03-03 NOTE — H&P (Signed)
Procedure: Baseline screening colonoscopy  History: The patient is a 51 year old male born 06-29-1965. He is scheduled to undergo his first screening colonoscopy today. There is no family history of colon cancer  Past medical history: Hypertension. Allergic rhinitis with asthma. Carpal tunnel surgery. Left elbow fracture requiring surgery. Right ankle fracture surgery. Right shoulder fracture surgery.  Medication allergies: Voltaren caused rash. Biaxin caused nausea with vomiting. Amlodipine caused peripheral edema.  Exam: The patient is alert and lying comfortably on the endoscopy stretcher. Abdomen is soft and nontender to palpation. Cardiac exam reveals a regular rhythm. Lungs are clear to auscultation.  Plan: Proceed with screening colonoscopy

## 2016-03-03 NOTE — Anesthesia Preprocedure Evaluation (Signed)
Anesthesia Evaluation  Patient identified by MRN, date of birth, ID band Patient awake    Reviewed: Allergy & Precautions, NPO status , Patient's Chart, lab work & pertinent test results  History of Anesthesia Complications (+) history of anesthetic complications  Airway Mallampati: I  TM Distance: >3 FB Neck ROM: Full    Dental  (+) Teeth Intact, Dental Advisory Given   Pulmonary asthma ,    breath sounds clear to auscultation       Cardiovascular hypertension, Pt. on medications  Rhythm:Regular Rate:Normal     Neuro/Psych    GI/Hepatic GERD  Medicated and Controlled,  Endo/Other  Morbid obesity  Renal/GU      Musculoskeletal   Abdominal   Peds  Hematology   Anesthesia Other Findings Pt seen on 12/25/14 for questionable cough with sputum production.  On exam his lungs were clear and he was afebrile (98.3).  He has environmental allergies that cause this type of reaction periodically. I think he is able to have his planned procedure on 12/26/14.  Reproductive/Obstetrics                             Anesthesia Physical  Anesthesia Plan  ASA: III  Anesthesia Plan: MAC   Post-op Pain Management:    Induction: Intravenous  Airway Management Planned:   Additional Equipment:   Intra-op Plan:   Post-operative Plan:   Informed Consent: I have reviewed the patients History and Physical, chart, labs and discussed the procedure including the risks, benefits and alternatives for the proposed anesthesia with the patient or authorized representative who has indicated his/her understanding and acceptance.   Dental advisory given  Plan Discussed with: CRNA  Anesthesia Plan Comments:         Anesthesia Quick Evaluation

## 2016-03-04 ENCOUNTER — Encounter (HOSPITAL_COMMUNITY): Payer: Self-pay | Admitting: Gastroenterology

## 2016-06-14 ENCOUNTER — Encounter (HOSPITAL_COMMUNITY): Payer: Self-pay | Admitting: Emergency Medicine

## 2016-06-14 ENCOUNTER — Ambulatory Visit (INDEPENDENT_AMBULATORY_CARE_PROVIDER_SITE_OTHER): Payer: No Typology Code available for payment source

## 2016-06-14 ENCOUNTER — Encounter (HOSPITAL_COMMUNITY): Payer: Self-pay

## 2016-06-14 ENCOUNTER — Ambulatory Visit (HOSPITAL_COMMUNITY)
Admission: EM | Admit: 2016-06-14 | Discharge: 2016-06-14 | Disposition: A | Discharge status: Nursing Home (Includes ICF) | Payer: No Typology Code available for payment source | Attending: Family Medicine | Admitting: Family Medicine

## 2016-06-14 DIAGNOSIS — J189 Pneumonia, unspecified organism: Secondary | ICD-10-CM | POA: Diagnosis not present

## 2016-06-14 DIAGNOSIS — J45909 Unspecified asthma, uncomplicated: Secondary | ICD-10-CM | POA: Diagnosis not present

## 2016-06-14 DIAGNOSIS — J111 Influenza due to unidentified influenza virus with other respiratory manifestations: Secondary | ICD-10-CM | POA: Insufficient documentation

## 2016-06-14 DIAGNOSIS — I10 Essential (primary) hypertension: Secondary | ICD-10-CM | POA: Diagnosis not present

## 2016-06-14 DIAGNOSIS — Z7982 Long term (current) use of aspirin: Secondary | ICD-10-CM | POA: Insufficient documentation

## 2016-06-14 DIAGNOSIS — R509 Fever, unspecified: Secondary | ICD-10-CM | POA: Diagnosis present

## 2016-06-14 DIAGNOSIS — R6889 Other general symptoms and signs: Secondary | ICD-10-CM | POA: Diagnosis not present

## 2016-06-14 DIAGNOSIS — Z79899 Other long term (current) drug therapy: Secondary | ICD-10-CM | POA: Diagnosis not present

## 2016-06-14 LAB — COMPREHENSIVE METABOLIC PANEL
ALBUMIN: 4.1 g/dL (ref 3.5–5.0)
ALK PHOS: 55 U/L (ref 38–126)
ALT: 19 U/L (ref 17–63)
AST: 22 U/L (ref 15–41)
Anion gap: 12 (ref 5–15)
BUN: 13 mg/dL (ref 6–20)
CALCIUM: 8.8 mg/dL — AB (ref 8.9–10.3)
CHLORIDE: 100 mmol/L — AB (ref 101–111)
CO2: 24 mmol/L (ref 22–32)
CREATININE: 1.08 mg/dL (ref 0.61–1.24)
GFR calc Af Amer: >60 mL/min (ref >60)
GFR calc non Af Amer: >60 mL/min (ref >60)
GLUCOSE: 120 mg/dL — AB (ref 65–99)
Potassium: 3.9 mmol/L (ref 3.5–5.1)
SODIUM: 136 mmol/L (ref 135–145)
Total Bilirubin: 1 mg/dL (ref 0.3–1.2)
Total Protein: 7.9 g/dL (ref 6.5–8.1)

## 2016-06-14 LAB — URINALYSIS, ROUTINE W REFLEX MICROSCOPIC
Bacteria, UA: NONE SEEN
Bilirubin Urine: NEGATIVE
GLUCOSE, UA: NEGATIVE mg/dL
Ketones, ur: 5 mg/dL — AB
Leukocytes, UA: NEGATIVE
Nitrite: NEGATIVE
Protein, ur: NEGATIVE mg/dL
SQUAMOUS EPITHELIAL / LPF: NONE SEEN
Specific Gravity, Urine: 1.027 (ref 1.005–1.030)
pH: 5 (ref 5.0–8.0)

## 2016-06-14 LAB — CBC WITH DIFFERENTIAL/PLATELET
BASOS PCT: 0 %
Basophils Absolute: 0 10*3/uL (ref 0.0–0.1)
EOS ABS: 0.1 10*3/uL (ref 0.0–0.7)
EOS PCT: 1 %
HCT: 44.6 % (ref 39.0–52.0)
HEMOGLOBIN: 15.1 g/dL (ref 13.0–17.0)
LYMPHS PCT: 11 %
Lymphs Abs: 1.4 10*3/uL (ref 0.7–4.0)
MCH: 29.7 pg (ref 26.0–34.0)
MCHC: 33.9 g/dL (ref 30.0–36.0)
MCV: 87.6 fL (ref 78.0–100.0)
MONO ABS: 1.8 10*3/uL — AB (ref 0.1–1.0)
Monocytes Relative: 15 %
NEUTROS ABS: 9 10*3/uL — AB (ref 1.7–7.7)
Neutrophils Relative %: 73 %
Platelets: 210 10*3/uL (ref 150–400)
RBC: 5.09 MIL/uL (ref 4.22–5.81)
RDW: 13 % (ref 11.5–15.5)
WBC: 12.3 10*3/uL — ABNORMAL HIGH (ref 4.0–10.5)

## 2016-06-14 LAB — I-STAT CG4 LACTIC ACID, ED: Lactic Acid, Venous: 1.57 mmol/L (ref 0.5–1.9)

## 2016-06-14 MED ORDER — DEXAMETHASONE SODIUM PHOSPHATE 10 MG/ML IJ SOLN
INTRAMUSCULAR | Status: AC
  Filled 2016-06-14: qty 1

## 2016-06-14 MED ORDER — KETOROLAC TROMETHAMINE 60 MG/2ML IM SOLN: 60.0000 mg | Freq: Once | INTRAMUSCULAR | Status: DC

## 2016-06-14 MED ORDER — ACETAMINOPHEN 325 MG PO TABS
650.0000 mg | ORAL_TABLET | Freq: Once | ORAL | Status: AC
  Administered 2016-06-14: 650 mg via ORAL

## 2016-06-14 MED ORDER — ONDANSETRON 4 MG PO TBDP
4.0000 mg | ORAL_TABLET | Freq: Once | ORAL | Status: DC
Start: 2016-06-14 — End: 2016-06-14

## 2016-06-14 MED ORDER — ACETAMINOPHEN 325 MG PO TABS
ORAL_TABLET | ORAL | Status: AC
  Filled 2016-06-14: qty 2

## 2016-06-14 MED ORDER — DEXAMETHASONE SODIUM PHOSPHATE 10 MG/ML IJ SOLN: 10.0000 mg | Freq: Once | INTRAMUSCULAR | Status: DC

## 2016-06-14 MED ORDER — ONDANSETRON 4 MG PO TBDP
ORAL_TABLET | ORAL | Status: AC
  Filled 2016-06-14: qty 1

## 2016-06-14 MED ORDER — KETOROLAC TROMETHAMINE 60 MG/2ML IM SOLN
INTRAMUSCULAR | Status: AC
  Filled 2016-06-14: qty 2

## 2016-06-14 NOTE — ED Triage Notes (Signed)
Pt c/o cold sx onset: 6 days  Sx include: chills, vomiting, fevers, prod cough, nasal congestion, SOB, wheezing, back pain  Taking: OTC cold meds w/temp relief.... Last had ibup at 0930  A&O x4... NAD

## 2016-06-14 NOTE — ED Provider Notes (Signed)
CSN: [id number]     Arrival date & time 06/14/16  1929 History   First MD Initiated Contact with Patient 06/14/16 2007     Chief Complaint  Patient presents with  . URI   (Consider location/radiation/quality/duration/timing/severity/associated sxs/prior Treatment) Patient is a 51 y.o. Male, started getting sick on Monday with fever, chills, fatigue, body aches, nasal congestion, coughing, sore throat and headache with nausea, vomiting and photosensitivity. He is occasionally dizzy. He does reports feeling weak. He does have some shortness of breath and some wheezing. He was exposed to a friend last weekend who had influenza. Patient reports unable to tolerate oral fluid due to nausea and vomiting.        Past Medical History:  Diagnosis Date  . Acid reflux    occasional - no current med.  . Allergic to dogs   . Asthma    hyper reactive disease  . Cat allergies   . Complication of anesthesia    states is hard to wake up post-op  . Dental crown present   . Fracture of coronoid process of left ulna 12/10/2014  . Hypertension    has been on med. x 10 yr.; not completely controlled, per pt.  . Joint disorder    states jaw locks if opens mouth wide  . Left radial head fracture 12/10/2014  . Ligament tear 12/10/2014   lateral collateral ligament tear elbow  . Pollen allergy   . Reactive airway disease    daily and prn inhalers  . Tinnitus of both ears    Past Surgical History:  Procedure Laterality Date  . ANKLE SURGERY Right age 31   repair tendons and ligaments  . CARPAL TUNNEL RELEASE Right 07/11/2009  . CARPAL TUNNEL RELEASE Left 08/15/2009  . COLONOSCOPY WITH PROPOFOL N/A 03/03/2016   Procedure: COLONOSCOPY WITH PROPOFOL;  Surgeon: Charolett Bumpers, MD;  Location: [facility];  Service: Endoscopy;  Laterality: N/A;  . ORIF ELBOW FRACTURE Left 12/26/2014   Procedure: CORONOID OPEN REDUCTION INTERNAL FIXATION (ORIF) ;  Surgeon: Betha Loa, MD;  Location: [facility];  Service: Orthopedics;  Laterality: Left;  . RADIAL HEAD ARTHROPLASTY Left 12/26/2014   Procedure: LEFT RADIAL HEAD ARTHROPLASTY;  Surgeon: Betha Loa, MD;  Location: [facility];  Service: Orthopedics;  Laterality: Left;  . SHOULDER ARTHROSCOPY Right   . ULNAR COLLATERAL LIGAMENT REPAIR Left 12/26/2014   Procedure: REPAIR LATERAL COLLATERAL LIGAMENT ;  Surgeon: Betha Loa, MD;  Location: [facility];  Service: Orthopedics;  Laterality: Left;   History reviewed. No pertinent family history. Social History  Substance Use Topics  . Smoking status: Never Smoker  . Smokeless tobacco: Never Used  . Alcohol use Yes     Comment: occasionally    Review of Systems  Constitutional: Positive for chills, fatigue and fever.  HENT: Positive for congestion, rhinorrhea and sore throat. Negative for sneezing.   Respiratory: Positive for cough, shortness of breath and wheezing.   Gastrointestinal: Positive for nausea and vomiting. Negative for abdominal pain.  Neurological: Positive for dizziness and headaches.    Allergies  Diclofenac; Amlodipine; and Clarithromycin  Home Medications   Prior to Admission medications   Medication Sig Start Date End Date Taking? Authorizing Provider  albuterol (PROVENTIL HFA;VENTOLIN HFA) 108 (90 BASE) MCG/ACT inhaler Inhale 1-2 puffs into the lungs every 6 (six) hours as needed for wheezing or shortness of breath.   Yes Historical Provider, MD  aspirin 81 MG tablet Take 81 mg by  mouth every evening.    Yes Historical Provider, MD  benazepril (LOTENSIN) 20 MG tablet Take 20 mg by mouth at bedtime.    Yes Historical Provider, MD  budesonide-formoterol (SYMBICORT) 160-4.5 MCG/ACT inhaler Inhale 1 puff into the lungs 2 (two) times daily.    Yes Historical Provider, MD  glucosamine-chondroitin 500-400 MG tablet Take 1 tablet by mouth 3 (three) times daily.   Yes Historical Provider, MD  ibuprofen (ADVIL,MOTRIN) 200 MG tablet Take 200  mg by mouth every 6 (six) hours as needed for mild pain or moderate pain.    Yes Historical Provider, MD  loratadine (CLARITIN) 10 MG tablet Take 10 mg by mouth every evening.    Yes Historical Provider, MD  montelukast (SINGULAIR) 10 MG tablet Take 10 mg by mouth at bedtime.     Yes Historical Provider, MD  Omega-3 Fatty Acids (FISH OIL) 1200 MG CAPS Take 1 capsule by mouth every evening.   Yes Historical Provider, MD   Meds Ordered and Administered this Visit   Medications  acetaminophen (TYLENOL) tablet 650 mg (650 mg Oral Given 06/14/16 1955)    BP (!) 149/91 (BP Location: Left Arm) Comment: notified rn  Pulse (!) 136 Comment: notified   Temp (!) 103.1 F (39.5 C) (Oral) Comment: notified rn  Resp 18   SpO2 94% Comment: notified rn Orthostatic VS for the past 24 hrs:  BP- Lying Pulse- Lying BP- Sitting Pulse- Sitting BP- Standing at 0 minutes Pulse- Standing at 0 minutes  06/14/16 2016 146/87 123 (!) 149/92 126 147/86 129    Physical Exam  Constitutional: He is oriented to person, place, and time.  Appears ill, laying on the exam table in the dark  HENT:  Head: Normocephalic and atraumatic.  Right Ear: External ear normal.  Left Ear: External ear normal.  Nose: Nose normal.  Mouth/Throat: Oropharynx is clear and moist. No oropharyngeal exudate.  Eyes: Conjunctivae are normal. Pupils are equal, round, and reactive to light.  Neck: Normal range of motion. Neck supple.  Cardiovascular: Normal rate, regular rhythm and normal heart sounds.   Pulmonary/Chest: Effort normal.  Has rales on right lobes, also has decreased breath sounds.   Abdominal: Soft. Bowel sounds are normal. He exhibits no distension. There is no tenderness.  Lymphadenopathy:    He has no cervical adenopathy.  Neurological: He is alert and oriented to person, place, and time. No cranial nerve deficit. Coordination normal.  Skin: Skin is warm and dry.  Nursing note and vitals reviewed.   Urgent Care Course      Procedures (including critical care time)  Labs Review Labs Reviewed - No data to display  Imaging Review Dg Chest 2 View  Result Date: 06/14/2016 CLINICAL DATA:  Cough for 1 week EXAM: CHEST  2 VIEW COMPARISON:  02/23/2014 FINDINGS: Non inclusion of the CP angles on the frontal view. No focal consolidation or effusion. Normal cardiomediastinal silhouette. No pneumothorax. IMPRESSION: No active cardiopulmonary disease. Electronically Signed   By: Jasmine Pang M.D.   On: 06/14/2016 20:31     MDM   1. Flu-like symptoms   2. Community acquired pneumonia, unspecified laterality    Highly suspecting that patient has influenza and/or pneumonia despite negative chest xray. I also believe that patient is dehydrated and IV fluid may be beneficial. I feel uncomfortable discharging patient home given his sick appearance, vital signs, not able to tolerate oral intact and rales on exam. Patient send to ER for further treatment.    Mosetta Putt  Sherrilee Gilles, NP 06/14/16 2044

## 2016-06-14 NOTE — ED Triage Notes (Signed)
Pt endorses productive cough, vomiting, and fever/chills since Monday. Pt sent from [facility]. Given tylenol there, oral temp 99.7 here. Breath sounds clear. VSS

## 2016-06-15 ENCOUNTER — Emergency Department (HOSPITAL_COMMUNITY)
Admission: EM | Admit: 2016-06-15 | Discharge: 2016-06-15 | Disposition: A | Discharge status: Home / Self Care | Payer: No Typology Code available for payment source | Attending: Emergency Medicine | Admitting: Emergency Medicine

## 2016-06-15 DIAGNOSIS — R69 Illness, unspecified: Secondary | ICD-10-CM

## 2016-06-15 DIAGNOSIS — J111 Influenza due to unidentified influenza virus with other respiratory manifestations: Secondary | ICD-10-CM

## 2016-06-15 MED ORDER — ONDANSETRON HCL 4 MG/2ML IJ SOLN
4.0000 mg | Freq: Once | INTRAMUSCULAR | Status: AC
  Administered 2016-06-15: 4 mg via INTRAVENOUS
  Filled 2016-06-15: qty 2

## 2016-06-15 MED ORDER — IPRATROPIUM-ALBUTEROL 0.5-2.5 (3) MG/3ML IN SOLN
3.0000 mL | Freq: Once | RESPIRATORY_TRACT | Status: AC
  Administered 2016-06-15: 3 mL via RESPIRATORY_TRACT
  Filled 2016-06-15: qty 3

## 2016-06-15 MED ORDER — DICYCLOMINE HCL 10 MG/ML IM SOLN
20.0000 mg | Freq: Once | INTRAMUSCULAR | Status: AC
  Administered 2016-06-15: 20 mg via INTRAMUSCULAR
  Filled 2016-06-15: qty 2

## 2016-06-15 MED ORDER — AZITHROMYCIN 250 MG PO TABS: 250.0000 mg | ORAL_TABLET | Freq: Every day | ORAL | 0 refills | Status: AC

## 2016-06-15 MED ORDER — SODIUM CHLORIDE 0.9 % IV BOLUS (SEPSIS): 1000.0000 mL | Freq: Once | INTRAVENOUS | Status: DC

## 2016-06-15 MED ORDER — KETOROLAC TROMETHAMINE 30 MG/ML IJ SOLN
30.0000 mg | Freq: Once | INTRAMUSCULAR | Status: AC
  Administered 2016-06-15: 30 mg via INTRAVENOUS
  Filled 2016-06-15: qty 1

## 2016-06-15 MED ORDER — ONDANSETRON 4 MG PO TBDP: 4.0000 mg | ORAL_TABLET | Freq: Three times a day (TID) | ORAL | 0 refills | Status: AC | PRN

## 2016-06-15 MED ORDER — SODIUM CHLORIDE 0.9 % IV BOLUS (SEPSIS)
1000.0000 mL | Freq: Once | INTRAVENOUS | Status: AC
  Administered 2016-06-15: 1000 mL via INTRAVENOUS

## 2016-06-15 NOTE — Discharge Instructions (Signed)
You may alternate Tylenol 1000 mg every 6 hours as needed for pain and Ibuprofen 800 mg every 8 hours as needed for pain.  Please take Ibuprofen with food.   You may alternate Tylenol 1000 mg every 6 hours as needed for fever and pain and ibuprofen 800 mg every 8 hours as needed for fever and pain. Please rest and drink plenty of fluids. This is a viral illness causing your symptoms. You do not need antibiotics for a virus. You may use over-the-counter nasal saline spray and Afrin nasal saline spray as needed for nasal congestion. Please do not use Afrin for more than 3 days in a row. You may use Mucinex as needed for cough.  This may take 7-14 days to run its course.  We do not test for the flu from the emergency department as we do not have rapid flu swabs and it takes hours for this test to come back and it would not change our management. The flu is treated like any other virus with supportive measures as listed above. At this time you are outside the treatment window for Tamiflu. Tamiflu has to be taken within the first 48 hours of symptoms.    Antibiotics if your symptoms are not improving in the next 4-5 days and her cough becomes more productive in you continue to have fevers. I think this is likely a viral illness and therefore antibiotics are not going to be helpful for this unless you develop symptoms of pneumonia.

## 2016-06-15 NOTE — ED Provider Notes (Signed)
TIME SEEN: 1:38 AM By signing my name below, I, Bobbie Stack, attest that this documentation has been prepared under the direction and in the presence of Makynli Stills N Rhen Dossantos, DO. Electronically Signed: Bobbie Stack, Scribe. 06/15/16. 1:40 AM.  CHIEF COMPLAINT: Fever/Chills, Cough, and Vomiting  HPI: HPI Comments: Carl Graves is a 51 y.o. male who presents to the Emergency Department complaining of dry cough, nausea, vomiting, and fever/chills for the past 5 to 6 days. He also reports associated abdominal cramps from vomiting and generalized body aches. He has tried antiemetic medication around 3 pm today and it resolved his nausea for about 20 minutes. He was seen at [facility] prior to coming here today and was give tylenol and some antiemetic medication. He was advised to come here for further evaluation. He states that he has been seeing black dots but this is normal for him. He denies SOB, rhinorrhea, or any urinary symptoms. No neck pain or neck stiffness. No chest pain.  ROS: See HPI Constitutional: Fever/Chills and diaphoresis Eyes: no drainage  ENT: no runny nose Cardiovascular: no chest pain  Resp: Dry Cough. no SOB. Marland Kitchen GI: Vomiting, nausea, and abdominal pain. GU: no dysuria Integumentary: no rash  Allergy: no hives  Musculoskeletal: Myalgia. no leg swelling. . Neurological: no slurred speech ROS otherwise negative  PAST MEDICAL HISTORY/PAST SURGICAL HISTORY:  Past Medical History:  Diagnosis Date  . Acid reflux    occasional - no current med.  . Allergic to dogs   . Asthma    hyper reactive disease  . Cat allergies   . Complication of anesthesia    states is hard to wake up post-op  . Dental crown present   . Fracture of coronoid process of left ulna 12/10/2014  . Hypertension    has been on med. x 10 yr.; not completely controlled, per pt.  . Joint disorder    states jaw locks if opens mouth wide  . Left radial head fracture 12/10/2014  . Ligament tear 12/10/2014    lateral collateral ligament tear elbow  . Pollen allergy   . Reactive airway disease    daily and prn inhalers  . Tinnitus of both ears     MEDICATIONS:  Prior to Admission medications   Medication Sig Start Date End Date Taking? Authorizing Provider  albuterol (PROVENTIL HFA;VENTOLIN HFA) 108 (90 BASE) MCG/ACT inhaler Inhale 1-2 puffs into the lungs every 6 (six) hours as needed for wheezing or shortness of breath.    Historical Provider, MD  aspirin 81 MG tablet Take 81 mg by mouth every evening.     Historical Provider, MD  benazepril (LOTENSIN) 20 MG tablet Take 20 mg by mouth at bedtime.     Historical Provider, MD  budesonide-formoterol (SYMBICORT) 160-4.5 MCG/ACT inhaler Inhale 1 puff into the lungs 2 (two) times daily.     Historical Provider, MD  glucosamine-chondroitin 500-400 MG tablet Take 1 tablet by mouth 3 (three) times daily.    Historical Provider, MD  ibuprofen (ADVIL,MOTRIN) 200 MG tablet Take 200 mg by mouth every 6 (six) hours as needed for mild pain or moderate pain.     Historical Provider, MD  loratadine (CLARITIN) 10 MG tablet Take 10 mg by mouth every evening.     Historical Provider, MD  montelukast (SINGULAIR) 10 MG tablet Take 10 mg by mouth at bedtime.      Historical Provider, MD  Omega-3 Fatty Acids (FISH OIL) 1200 MG CAPS Take 1 capsule by mouth every evening.  Historical Provider, MD    ALLERGIES:  Allergies  Allergen Reactions  . Diclofenac Anaphylaxis  . Amlodipine Other (See Comments)    SWELLING OF LEGS  . Clarithromycin Nausea And Vomiting    SOCIAL HISTORY:  Social History  Substance Use Topics  . Smoking status: Never Smoker  . Smokeless tobacco: Never Used  . Alcohol use Yes     Comment: occasionally    FAMILY HISTORY: History reviewed. No pertinent family history.  EXAM: BP (!) 157/87   Pulse 99   Resp 20   Ht  (1.727 m)   Wt 270 lb (122.5 kg)   SpO2 95%   BMI 41.05 kg/m  CONSTITUTIONAL: Alert and oriented and  responds appropriately to questions. Well-appearing; well-nourished HEAD: Normocephalic EYES: Conjunctivae clear, PERRL ENT: normal nose; no rhinorrhea; moist mucous membranes; pharynx without lesions noted; No pharyngeal erythema or petechiae, no tonsillar hypertrophy or exudate, no uvular deviation, no unilateral swelling, no trismus or drooling, no muffled voice, normal phonation, no stridor, no dental caries present, no drainable dental abscess noted, no Ludwig's angina, tongue sits flat in the bottom of the mouth, no angioedema, no facial erythema or warmth, no facial swelling; no pain with movement of the neck. NECK: Supple, no meningismus, no LAD  CARD: RRR; S1 and S2 appreciated; no murmurs, no clicks, no rubs, no gallops RESP: Normal chest excursion without splinting or tachypnea; breath sounds clear and equal bilaterally; no wheezes, no rhonchi, no rales,  ABD/GI: Normal bowel sounds; non-distended; soft, non-tender, no rebound, no guarding BACK:  The back appears normal and is non-tender to palpation, there is no CVA tenderness EXT: Normal ROM in all joints; non-tender to palpation; no edema; normal capillary refill; no cyanosis    SKIN: Normal color for age and race; warm NEURO: Moves all extremities equally PSYCH: The patient's mood and manner are appropriate. Grooming and personal hygiene are appropriate.  MEDICAL DECISION MAKING: Patient here with flulike symptoms. Labs obtained in triage and normal lactate. He has mild leukocytosis with left shift. Chest x-ray to urgent care unremarkable. Urine shows no sign of infection. He is outside treatment window for Tamiflu. We'll treat symptomatically with Toradol, duoneb, IV fluids, Zofran.  ED PROGRESS: Patient reports feeling "much better" after receiving above medications. Drinking without difficulty. Will discharge with Zofran to take at home as needed. Recommend alternating Tylenol and Motrin. He is requesting antibiotics as he was told  he would be given them from urgent care. Clinically I do not think this is a bacterial infection but have discussed with him his symptoms are not improving in the next 4-5 days or his cough becomes more productive we will discharge him with azithromycin but have urged him not to take this medication at this time as clinically we do not see any sign of pneumonia. His lungs are clear to auscultation here. He is not hypoxic and has no respiratory distress. I feel he is safe for discharge home he is comfortable with this plan.   At this time, I do not feel there is any life-threatening condition present. I have reviewed and discussed all results (EKG, imaging, lab, urine as appropriate) and exam findings with patient/family. I have reviewed nursing notes and appropriate previous records.  I feel the patient is safe to be discharged home without further emergent workup and can continue workup as an outpatient as needed. Discussed usual and customary return precautions. Patient/family verbalize understanding and are comfortable with this plan.  Outpatient follow-up has been  provided if needed. All questions have been answered.    I personally performed the services described in this documentation, which was scribed in my presence. The recorded information has been reviewed and is accurate.    Layla Maw Rilyn Scroggs, DO 06/15/16 [phone]

## 2016-11-28 ENCOUNTER — Ambulatory Visit (HOSPITAL_COMMUNITY)
Admission: EM | Admit: 2016-11-28 | Discharge: 2016-11-28 | Disposition: A | Discharge status: Home / Self Care | Payer: Self-pay | Attending: Physician Assistant | Admitting: Physician Assistant

## 2016-11-28 ENCOUNTER — Encounter (HOSPITAL_COMMUNITY): Payer: Self-pay | Admitting: Family Medicine

## 2016-11-28 DIAGNOSIS — R0789 Other chest pain: Secondary | ICD-10-CM

## 2016-11-28 DIAGNOSIS — T148XXA Other injury of unspecified body region, initial encounter: Secondary | ICD-10-CM

## 2016-11-28 DIAGNOSIS — M25511 Pain in right shoulder: Secondary | ICD-10-CM

## 2016-11-28 MED ORDER — CYCLOBENZAPRINE HCL 10 MG PO TABS: 10.0000 mg | ORAL_TABLET | Freq: Every evening | ORAL | 0 refills | Status: AC | PRN
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
## 2016-11-28 NOTE — Discharge Instructions (Signed)
Your exam was most consistent with muscle strain. Start Ibuprofen  three times a day for 10 days, take with food to prevent stomach upset. Flexeril as needed at night. Flexeril can make you drowsy, so do not take if you are going to drive, operate heavy machinery, or make important decisions. Ice/heat compresses as needed. This can take up to 3-4 weeks to completely resolve, but you should be feeling better each week. Follow up here or with PCP if symptoms worsen, changes for reevaluation. If experiencing shortness of breath, chest pain, trouble breathing, go to the emergency department for further evaluation.

## 2018-03-31 ENCOUNTER — Other Ambulatory Visit: Payer: Self-pay | Admitting: Internal Medicine

## 2018-03-31 DIAGNOSIS — R112 Nausea with vomiting, unspecified: Secondary | ICD-10-CM

## 2018-04-05 ENCOUNTER — Ambulatory Visit
Admission: RE | Admit: 2018-04-05 | Discharge: 2018-04-05 | Disposition: A | Discharge status: Home / Self Care | Payer: No Typology Code available for payment source | Source: Ambulatory Visit | Attending: Internal Medicine | Admitting: Internal Medicine

## 2018-04-05 DIAGNOSIS — R112 Nausea with vomiting, unspecified: Secondary | ICD-10-CM

## 2018-06-03 IMAGING — DX DG CHEST 2V
2 series · 2 of 2 positions shown · non-contrast
Comparison: 02/23/2014

CLINICAL DATA: Cough for 1 week

EXAM:
CHEST  2 VIEW

[chest pa]
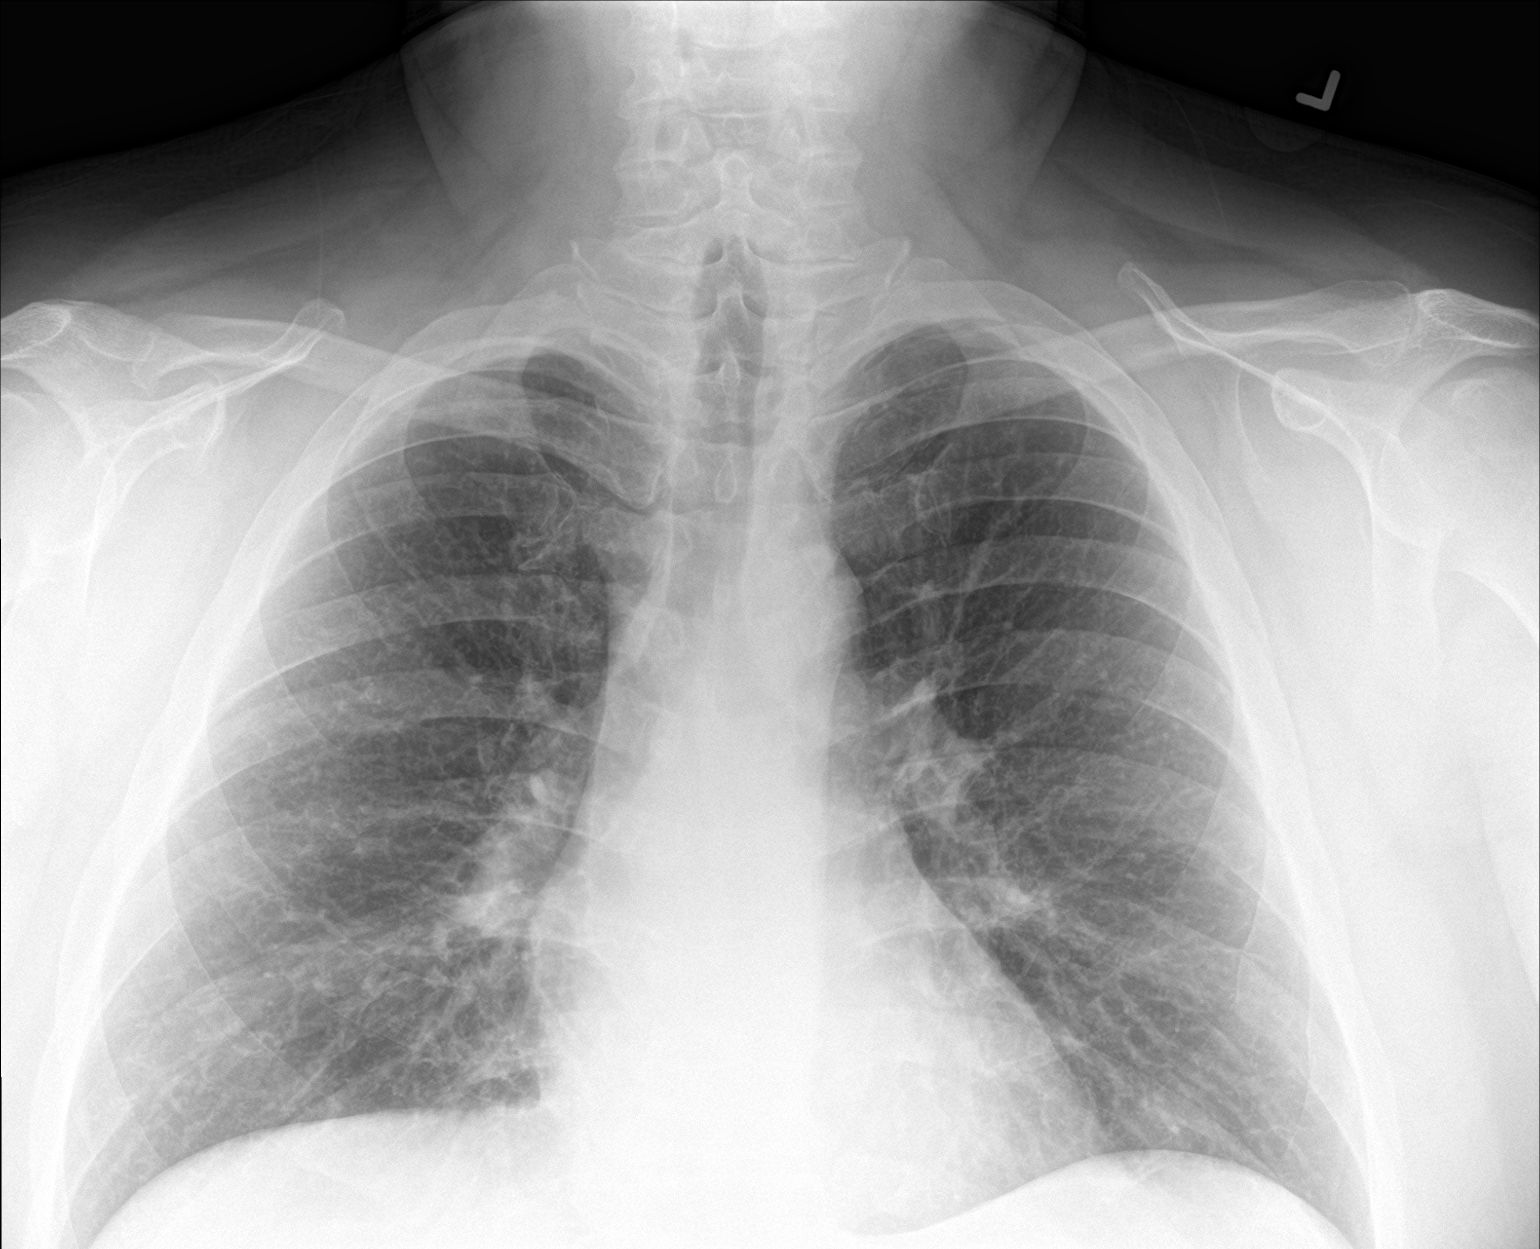

[chest lat]
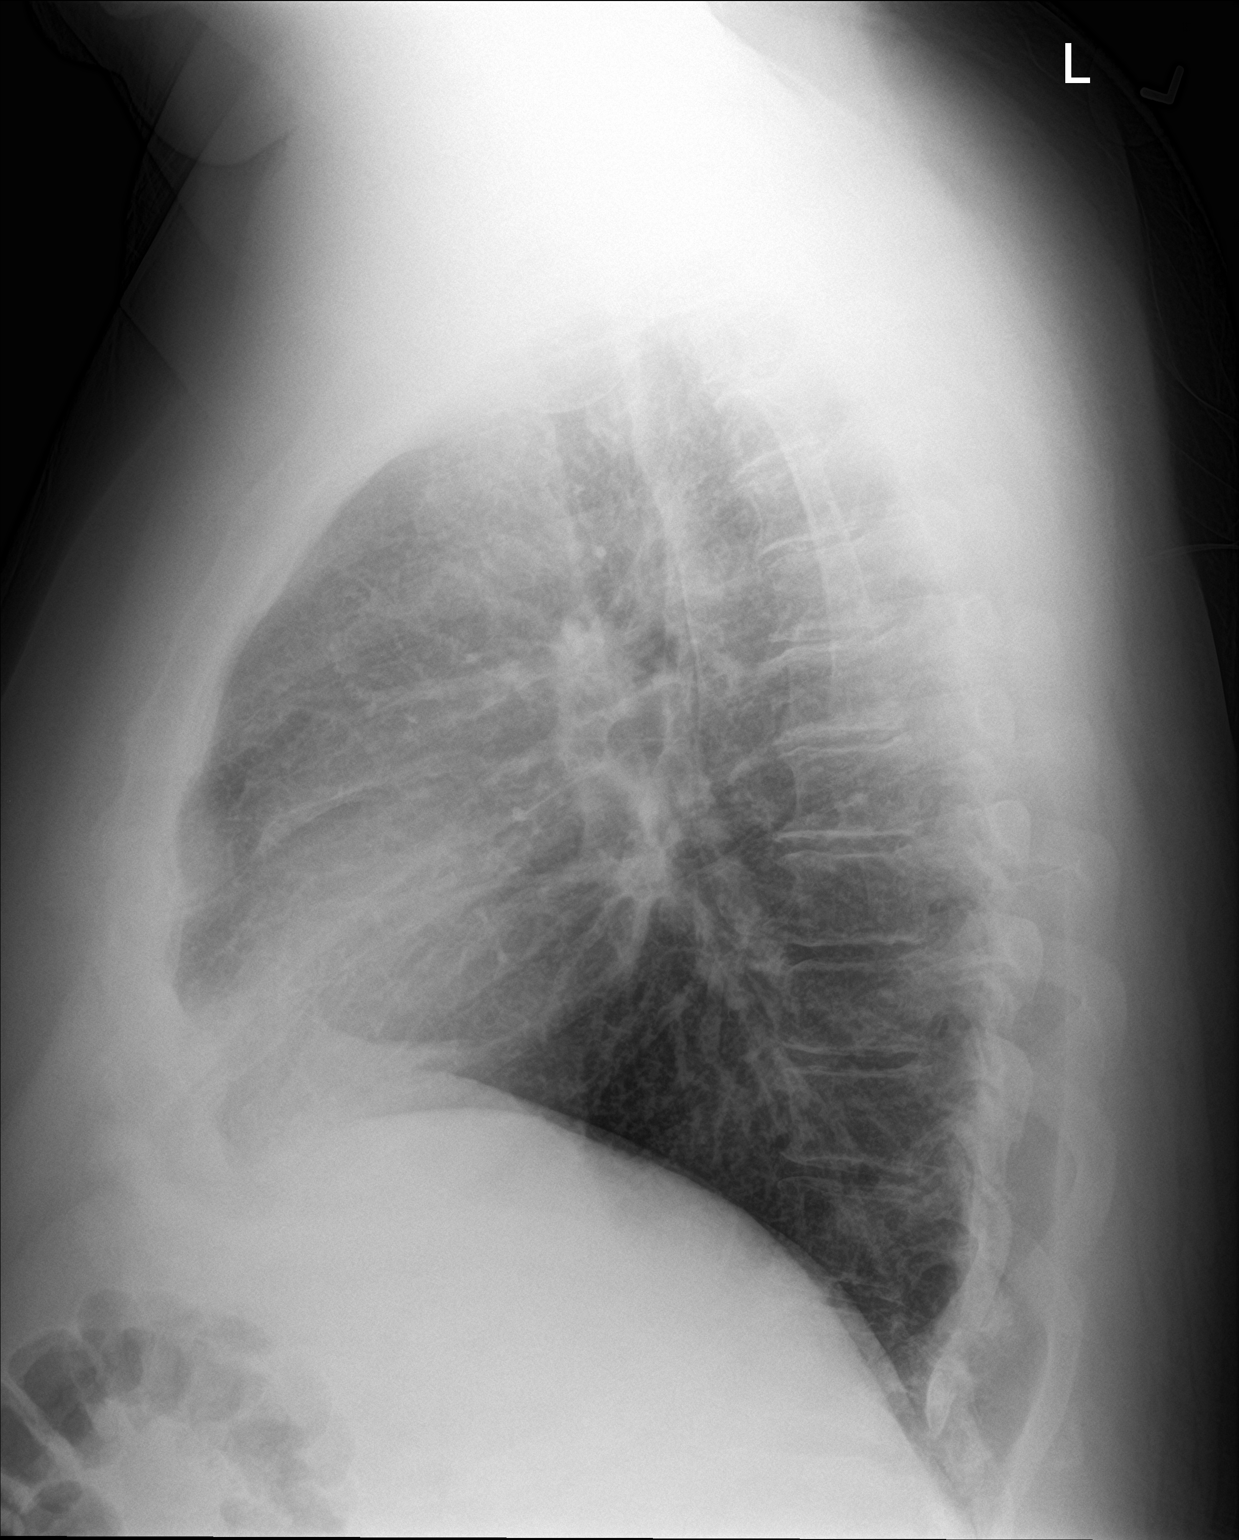

[2 of 2 positions shown; findings below may reference images not displayed]

FINDINGS: Non inclusion of the CP angles on the frontal view. No focal
consolidation or effusion. Normal cardiomediastinal silhouette. No
pneumothorax.
IMPRESSION: No active cardiopulmonary disease.

## 2018-12-08 ENCOUNTER — Other Ambulatory Visit: Payer: Self-pay | Admitting: Internal Medicine

## 2018-12-08 ENCOUNTER — Ambulatory Visit
Admission: RE | Admit: 2018-12-08 | Discharge: 2018-12-08 | Disposition: A | Discharge status: Home / Self Care | Payer: No Typology Code available for payment source | Source: Ambulatory Visit | Attending: Internal Medicine | Admitting: Internal Medicine

## 2018-12-08 DIAGNOSIS — M5489 Other dorsalgia: Secondary | ICD-10-CM

## 2019-03-17 ENCOUNTER — Other Ambulatory Visit: Payer: Self-pay | Admitting: Gastroenterology

## 2019-03-17 DIAGNOSIS — R112 Nausea with vomiting, unspecified: Secondary | ICD-10-CM

## 2020-02-01 ENCOUNTER — Other Ambulatory Visit: Payer: Self-pay | Admitting: Physician Assistant

## 2020-02-01 ENCOUNTER — Ambulatory Visit
Admission: RE | Admit: 2020-02-01 | Discharge: 2020-02-01 | Disposition: A | Discharge status: Home / Self Care | Payer: No Typology Code available for payment source | Source: Ambulatory Visit | Attending: Physician Assistant | Admitting: Physician Assistant

## 2020-02-01 DIAGNOSIS — M25552 Pain in left hip: Secondary | ICD-10-CM

## 2020-02-01 DIAGNOSIS — M546 Pain in thoracic spine: Secondary | ICD-10-CM

## 2020-06-07 ENCOUNTER — Other Ambulatory Visit: Payer: Self-pay | Admitting: Orthopedic Surgery

## 2020-06-07 DIAGNOSIS — M5416 Radiculopathy, lumbar region: Secondary | ICD-10-CM

## 2020-06-07 DIAGNOSIS — M5414 Radiculopathy, thoracic region: Secondary | ICD-10-CM

## 2020-06-24 ENCOUNTER — Ambulatory Visit
Admission: RE | Admit: 2020-06-24 | Discharge: 2020-06-24 | Disposition: A | Discharge status: Home / Self Care | Payer: No Typology Code available for payment source | Source: Ambulatory Visit | Attending: Orthopedic Surgery | Admitting: Orthopedic Surgery

## 2020-06-24 ENCOUNTER — Other Ambulatory Visit: Payer: Self-pay

## 2020-06-24 DIAGNOSIS — M5414 Radiculopathy, thoracic region: Secondary | ICD-10-CM

## 2021-05-16 ENCOUNTER — Other Ambulatory Visit: Payer: Self-pay | Admitting: Orthopedic Surgery

## 2021-05-16 DIAGNOSIS — M4722 Other spondylosis with radiculopathy, cervical region: Secondary | ICD-10-CM

## 2021-06-01 ENCOUNTER — Ambulatory Visit
Admission: RE | Admit: 2021-06-01 | Discharge: 2021-06-01 | Disposition: A | Discharge status: Home / Self Care | Payer: No Typology Code available for payment source | Source: Ambulatory Visit | Attending: Orthopedic Surgery | Admitting: Orthopedic Surgery

## 2021-06-01 DIAGNOSIS — M4722 Other spondylosis with radiculopathy, cervical region: Secondary | ICD-10-CM

## 2021-09-03 ENCOUNTER — Other Ambulatory Visit: Payer: Self-pay | Admitting: Orthopedic Surgery

## 2021-10-02 NOTE — Progress Notes (Signed)
Surgical Instructions    Your procedure is scheduled on Thursday August 17th .  Report to [facility] Main Entrance "A" at 5:30 A.M., then check in with the Admitting office.  Call this number if you have problems the morning of surgery:  [phone]   If you have any questions prior to your surgery date call [phone]: Open Monday-Friday 8am-4pm    Remember:  Do not eat after midnight the night before your surgery  You may drink clear liquids until 4:30am the morning of your surgery.   Clear liquids allowed are: Water, Non-Citrus Juices (without pulp), Carbonated Beverages, Clear Tea, Black Coffee ONLY (NO MILK, CREAM OR POWDERED CREAMER of any kind), and Gatorade  Please complete your PRE-SURGERY ENSURE that was provided to you by .4:30am.. the morning of surgery.  Please, if able, drink it in one setting. DO NOT SIP.     Take these medicines the morning of surgery with A SIP OF WATER: fluticasone-salmeterol (ADVAIR) 250-50 MCG/ACT AEPB - please bring with you to the hospital. gabapentin (NEURONTIN) 300 MG capsule pantoprazole (PROTONIX) 40 MG tablet  IF NEEDED  acetaminophen (TYLENOL) 500 MG tablet albuterol (PROVENTIL HFA;VENTOLIN HFA) 108 (90 BASE) MCG/ACT inhaler - please bring with you to the hospital. benzonatate (TESSALON) 100 MG capsule traMADol (ULTRAM) 50 MG tablet  As of today, STOP taking any Aspirin (unless otherwise instructed by your surgeon) MOBIC, Aleve, Naproxen, Ibuprofen, Motrin, Advil, Goody's, BC's, all herbal medications, fish oil, and all vitamins.           Do not wear jewelry . Do not wear lotions, powders, cologne or deodorant. Do not shave 48 hours prior to surgery.  Men may shave face and neck. Do not bring valuables to the hospital. Do not wear nail polish  [facility] is not responsible for any belongings or valuables.    Do NOT Smoke (Tobacco/Vaping)  24 hours prior to your procedure  If you use a CPAP at night, you may bring your mask  for your overnight stay.   Contacts, glasses, hearing aids, dentures or partials may not be worn into surgery, please bring cases for these belongings   For patients admitted to the hospital, discharge time will be determined by your treatment team.   Patients discharged the day of surgery will not be allowed to drive home, and someone needs to stay with them for 24 hours.   SURGICAL WAITING ROOM VISITATION Patients having surgery or a procedure may have no more than 2 support people in the waiting area - these visitors may rotate.   Children under the age of 47 must have an adult with them who is not the patient. If the patient needs to stay at the hospital during part of their recovery, the visitor guidelines for inpatient rooms apply. Pre-op nurse will coordinate an appropriate time for 1 support person to accompany patient in pre-op.  This support person may not rotate.   Please refer to the [facility] website for the visitor guidelines for Inpatients (after your surgery is over and you are in a regular room).    Special instructions:    Oral Hygiene is also important to reduce your risk of infection.  Remember - BRUSH YOUR TEETH THE MORNING OF SURGERY WITH YOUR REGULAR TOOTHPASTE   [facility]- Preparing For Surgery  Before surgery, you can play an important role. Because skin is not sterile, your skin needs to be as free of germs as possible. You can reduce the number of germs  on your skin by washing with CHG (chlorahexidine gluconate) Soap before surgery.  CHG is an antiseptic cleaner which kills germs and bonds with the skin to continue killing germs even after washing.     Please do not use if you have an allergy to CHG or antibacterial soaps. If your skin becomes reddened/irritated stop using the CHG.  Do not shave (including legs and underarms) for at least 48 hours prior to first CHG shower. It is OK to shave your face.  Please follow these instructions carefully.      Shower the NIGHT BEFORE SURGERY and the MORNING OF SURGERY with CHG Soap.   If you chose to wash your hair, wash your hair first as usual with your normal shampoo. After you shampoo, rinse your hair and body thoroughly to remove the shampoo.  Then [employer] and genitals (private parts) with your normal soap and rinse thoroughly to remove soap.  After that Use CHG Soap as you would any other liquid soap. You can apply CHG directly to the skin and wash gently with a scrungie or a clean washcloth.   Apply the CHG Soap to your body ONLY FROM THE NECK DOWN.  Do not use on open wounds or open sores. Avoid contact with your eyes, ears, mouth and genitals (private parts). Wash Face and genitals (private parts)  with your normal soap.   Wash thoroughly, paying special attention to the area where your surgery will be performed.  Thoroughly rinse your body with warm water from the neck down.  DO NOT shower/wash with your normal soap after using and rinsing off the CHG Soap.  Pat yourself dry with a CLEAN TOWEL.  Wear CLEAN PAJAMAS to bed the night before surgery  Place CLEAN SHEETS on your bed the night before your surgery  DO NOT SLEEP WITH PETS.   Day of Surgery:  Take a shower with CHG soap. Wear Clean/Comfortable clothing the morning of surgery Do not apply any deodorants/lotions.   Remember to brush your teeth WITH YOUR REGULAR TOOTHPASTE.    If you received a COVID test during your pre-op visit, it is requested that you wear a mask when out in public, stay away from anyone that may not be feeling well, and notify your surgeon if you develop symptoms. If you have been in contact with anyone that has tested positive in the last 10 days, please notify your surgeon.    Please read over the following fact sheets that you were given.

## 2021-10-03 ENCOUNTER — Encounter (HOSPITAL_COMMUNITY): Payer: Self-pay

## 2021-10-03 ENCOUNTER — Other Ambulatory Visit: Payer: Self-pay

## 2021-10-03 ENCOUNTER — Encounter (HOSPITAL_COMMUNITY)
Admission: RE | Admit: 2021-10-03 | Discharge: 2021-10-03 | Disposition: A | Discharge status: Home / Self Care | Payer: No Typology Code available for payment source | Source: Ambulatory Visit | Attending: Orthopedic Surgery | Admitting: Orthopedic Surgery

## 2021-10-03 VITALS — BP 133/79 | HR 72 | Temp 98.2°F | Resp 18 | Ht 68.0 in | Wt 297.0 lb

## 2021-10-03 DIAGNOSIS — Z01818 Encounter for other preprocedural examination: Secondary | ICD-10-CM | POA: Diagnosis not present

## 2021-10-03 HISTORY — DX: Pneumonia, unspecified organism: J18.9

## 2021-10-03 HISTORY — DX: Sleep apnea, unspecified: G47.30

## 2021-10-03 HISTORY — DX: Unspecified osteoarthritis, unspecified site: M19.90

## 2021-10-03 HISTORY — DX: COVID-19: U07.1

## 2021-10-03 HISTORY — DX: Prediabetes: R73.03

## 2021-10-03 LAB — BASIC METABOLIC PANEL
Anion gap: 9 (ref 5–15)
BUN: 12 mg/dL (ref 6–20)
CO2: 24 mmol/L (ref 22–32)
Calcium: 9 mg/dL (ref 8.9–10.3)
Chloride: 104 mmol/L (ref 98–111)
Creatinine, Ser: 0.83 mg/dL (ref 0.61–1.24)
GFR, Estimated: >60 mL/min (ref >60)
Glucose, Bld: 101 mg/dL — ABNORMAL HIGH (ref 70–99)
Potassium: 3.7 mmol/L (ref 3.5–5.1)
Sodium: 137 mmol/L (ref 135–145)

## 2021-10-03 LAB — CBC
HCT: 44.8 % (ref 39.0–52.0)
Hemoglobin: 14.9 g/dL (ref 13.0–17.0)
MCH: 30.2 pg (ref 26.0–34.0)
MCHC: 33.3 g/dL (ref 30.0–36.0)
MCV: 90.9 fL (ref 80.0–100.0)
Platelets: 247 10*3/uL (ref 150–400)
RBC: 4.93 MIL/uL (ref 4.22–5.81)
RDW: 13.4 % (ref 11.5–15.5)
WBC: 9.5 10*3/uL (ref 4.0–10.5)
nRBC: 0 % (ref 0.0–0.2)

## 2021-10-03 LAB — TYPE AND SCREEN
ABO/RH(D): A POS
Antibody Screen: NEGATIVE

## 2021-10-03 LAB — SURGICAL PCR SCREEN
MRSA, PCR: NEGATIVE
Staphylococcus aureus: NEGATIVE

## 2021-10-03 NOTE — Progress Notes (Signed)
Surgical Instructions   Your procedure is scheduled on Thursday, 10/10/21 at 7:30 AM.  Report to [facility] Main Entrance "A" at 5:30 AM, then check in with the Admitting office.  Call this number if you have problems the morning of surgery: [phone]    If you have any questions prior to your surgery date call [phone]: Open Monday-Friday 8am-4pm   Remember:  Do not eat after midnight the night before your surgery.   You may drink clear liquids until 4:30am the morning of your surgery.   Clear liquids allowed are: Water, Non-Citrus Juices (without pulp), Carbonated Beverages, Clear Tea, Black Coffee ONLY (NO MILK, CREAM, HONEY OR POWDERED CREAMER of any kind), and Gatorade  Please complete your PRE-SURGERY ENSURE that was provided to you by 4:30 AM the morning of surgery. DO NOT SIP.    Take these medicines the morning of surgery with A SIP OF WATER: fluticasone-salmeterol (ADVAIR) 250-50 MCG/ACT  gabapentin (NEURONTIN) 300 MG capsule pantoprazole (PROTONIX) 40 MG tablet  IF NEEDED  acetaminophen (TYLENOL) 500 MG tablet albuterol (PROVENTIL HFA;VENTOLIN HFA) 108 (90 BASE) MCG/ACT inhaler - please bring with you to the hospital. benzonatate (TESSALON) 100 MG capsule traMADol (ULTRAM) 50 MG tablet  As of today, STOP taking any Aspirin (unless otherwise instructed by your surgeon) MOBIC, Aleve, Naproxen, Ibuprofen, Motrin, Advil, Goody's, BC's, all herbal medications, fish oil, and all vitamins.        Do not wear jewelry . Do not wear lotions, powder, cologne or deodorant. Do not shave 48 hours prior to surgery. Men may shave face and neck. Do not bring valuables to the hospital. Do not wear nail polish  [facility] is not responsible for any belongings or valuables.    Do NOT Smoke (Tobacco/Vaping)  24 hours prior to your procedure  If you use a CPAP at night, you may bring your mask for your overnight stay.   Contacts, glasses, hearing aids, dentures or partials may  not be worn into surgery, please bring cases for these belongings   For patients admitted to the hospital, discharge time will be determined by your treatment team.   Patients discharged the day of surgery will not be allowed to drive home, and someone needs to stay with them for 24 hours.  SURGICAL WAITING ROOM VISITATION Patients having surgery or a procedure may have no more than 2 support people in the waiting area - these visitors may rotate.   Children under the age of 2 must have an adult with them who is not the patient. If the patient needs to stay at the hospital during part of their recovery, the visitor guidelines for inpatient rooms apply. Pre-op nurse will coordinate an appropriate time for 1 support person to accompany patient in pre-op.  This support person may not rotate.   Please refer to the [facility] website for the visitor guidelines for Inpatients (after your surgery is over and you are in a regular room).   Special instructions:    Oral Hygiene is also important to reduce your risk of infection.  Remember - BRUSH YOUR TEETH THE MORNING OF SURGERY WITH YOUR REGULAR TOOTHPASTE  - Preparing For Surgery  Before surgery, you can play an important role. Because skin is not sterile, your skin needs to be as free of germs as possible. You can reduce the number of germs on your skin by washing with CHG (chlorahexidine gluconate) Soap before surgery.  CHG is an antiseptic cleaner which kills germs and bonds  with the skin to continue killing germs even after washing.    Please do not use if you have an allergy to CHG or antibacterial soaps. If your skin becomes reddened/irritated stop using the CHG.  Do not shave (including legs and underarms) for at least 48 hours prior to first CHG shower. It is OK to shave your face.  Please follow these instructions carefully.    Shower the NIGHT BEFORE SURGERY and the MORNING OF SURGERY with CHG Soap.   If you chose to wash  your hair, wash your hair first as usual with your normal shampoo. After you shampoo, rinse your hair and body thoroughly to remove the shampoo.  Then [employer] and genitals (private parts) with your normal soap and rinse thoroughly to remove soap.  After that Use CHG Soap as you would any other liquid soap. You can apply CHG directly to the skin and wash gently with a scrungie or a clean washcloth.   Apply the CHG Soap to your body ONLY FROM THE NECK DOWN.  Do not use on open wounds or open sores. Avoid contact with your eyes, ears, mouth and genitals (private parts). Wash Face and genitals (private parts)  with your normal soap.   Wash thoroughly, paying special attention to the area where your surgery will be performed.  Thoroughly rinse your body with warm water from the neck down.  DO NOT shower/wash with your normal soap after using and rinsing off the CHG Soap.  Pat yourself dry with a CLEAN TOWEL.  Wear CLEAN PAJAMAS to bed the night before surgery  Place CLEAN SHEETS on your bed the night before your surgery  DO NOT SLEEP WITH PETS.  Day of Surgery: Take a shower with CHG soap. Wear Clean/Comfortable clothing the morning of surgery Do not apply any deodorants/lotions.   Remember to brush your teeth WITH YOUR REGULAR TOOTHPASTE.   Please read over the fact sheets that you were given.

## 2021-10-03 NOTE — Progress Notes (Signed)
PCP - Dr. Georgann Housekeeper   EKG - 10/03/21  CPAP - nightly   ERAS Protcol -yes PRE-SURGERY  G2-  pt is pre-diabetic    Anesthesia review: No.  Patient denies shortness of breath, fever, cough and chest pain at PAT appointment   All instructions explained to the patient, with a verbal understanding of the material. Patient agrees to go over the instructions while at home for a better understanding. Patient also instructed to self quarantine after being tested for COVID-19. The opportunity to ask questions was provided.

## 2021-10-10 ENCOUNTER — Ambulatory Visit (HOSPITAL_COMMUNITY): Payer: No Typology Code available for payment source | Admitting: Physician Assistant

## 2021-10-10 ENCOUNTER — Other Ambulatory Visit: Payer: Self-pay

## 2021-10-10 ENCOUNTER — Ambulatory Visit (HOSPITAL_COMMUNITY): Payer: No Typology Code available for payment source

## 2021-10-10 ENCOUNTER — Encounter (HOSPITAL_COMMUNITY)
Admission: RE | Disposition: A | Discharge status: Home / Self Care | Payer: Self-pay | Source: Home / Self Care | Attending: Orthopedic Surgery

## 2021-10-10 ENCOUNTER — Ambulatory Visit (HOSPITAL_COMMUNITY)
Admission: RE | Admit: 2021-10-10 | Discharge: 2021-10-10 | Disposition: A | Discharge status: Home / Self Care | Payer: No Typology Code available for payment source | Attending: Orthopedic Surgery | Admitting: Orthopedic Surgery

## 2021-10-10 ENCOUNTER — Encounter (HOSPITAL_COMMUNITY): Payer: Self-pay | Admitting: Orthopedic Surgery

## 2021-10-10 ENCOUNTER — Ambulatory Visit (HOSPITAL_BASED_OUTPATIENT_CLINIC_OR_DEPARTMENT_OTHER): Payer: No Typology Code available for payment source | Admitting: Anesthesiology

## 2021-10-10 DIAGNOSIS — I10 Essential (primary) hypertension: Secondary | ICD-10-CM

## 2021-10-10 DIAGNOSIS — G4730 Sleep apnea, unspecified: Secondary | ICD-10-CM | POA: Insufficient documentation

## 2021-10-10 DIAGNOSIS — M4802 Spinal stenosis, cervical region: Secondary | ICD-10-CM

## 2021-10-10 DIAGNOSIS — M5412 Radiculopathy, cervical region: Secondary | ICD-10-CM | POA: Insufficient documentation

## 2021-10-10 DIAGNOSIS — G4733 Obstructive sleep apnea (adult) (pediatric): Secondary | ICD-10-CM

## 2021-10-10 DIAGNOSIS — M5010 Cervical disc disorder with radiculopathy, unspecified cervical region: Secondary | ICD-10-CM

## 2021-10-10 DIAGNOSIS — Z9989 Dependence on other enabling machines and devices: Secondary | ICD-10-CM

## 2021-10-10 HISTORY — PX: ANTERIOR CERVICAL DECOMP/DISCECTOMY FUSION: SHX1161

## 2021-10-10 LAB — ABO/RH: ABO/RH(D): A POS

## 2021-10-10 SURGERY — ANTERIOR CERVICAL DECOMPRESSION/DISCECTOMY FUSION 2 LEVELS
Anesthesia: General | Site: Spine Cervical

## 2021-10-10 MED ORDER — FENTANYL CITRATE (PF) 250 MCG/5ML IJ SOLN
INTRAMUSCULAR | Status: AC
  Filled 2021-10-10: qty 5

## 2021-10-10 MED ORDER — BUPIVACAINE-EPINEPHRINE (PF) 0.25% -1:200000 IJ SOLN
INTRAMUSCULAR | Status: AC
  Filled 2021-10-10: qty 30

## 2021-10-10 MED ORDER — ONDANSETRON HCL 4 MG/2ML IJ SOLN
INTRAMUSCULAR | Status: DC | PRN
  Administered 2021-10-10: 4 mg via INTRAVENOUS

## 2021-10-10 MED ORDER — AMISULPRIDE (ANTIEMETIC) 5 MG/2ML IV SOLN: 10.0000 mg | Freq: Once | INTRAVENOUS | Status: DC | PRN

## 2021-10-10 MED ORDER — FENTANYL CITRATE (PF) 100 MCG/2ML IJ SOLN
INTRAMUSCULAR | Status: AC
  Filled 2021-10-10: qty 4

## 2021-10-10 MED ORDER — THROMBIN 20000 UNITS EX SOLR
CUTANEOUS | Status: AC
  Filled 2021-10-10: qty 20000

## 2021-10-10 MED ORDER — SURGIFLO WITH THROMBIN (HEMOSTATIC MATRIX KIT) OPTIME
TOPICAL | Status: DC | PRN
  Administered 2021-10-10: 1 via TOPICAL

## 2021-10-10 MED ORDER — ACETAMINOPHEN 500 MG PO TABS: 1000.0000 mg | ORAL_TABLET | Freq: Once | ORAL | Status: AC

## 2021-10-10 MED ORDER — FENTANYL CITRATE (PF) 250 MCG/5ML IJ SOLN
INTRAMUSCULAR | Status: DC | PRN
  Administered 2021-10-10 (×3): 50 ug via INTRAVENOUS
  Administered 2021-10-10: 100 ug via INTRAVENOUS
  Administered 2021-10-10: 50 ug via INTRAVENOUS

## 2021-10-10 MED ORDER — PROPOFOL 10 MG/ML IV BOLUS
INTRAVENOUS | Status: DC | PRN
  Administered 2021-10-10: 200 mg via INTRAVENOUS

## 2021-10-10 MED ORDER — CEFAZOLIN IN SODIUM CHLORIDE 3-0.9 GM/100ML-% IV SOLN
INTRAVENOUS | Status: AC
  Filled 2021-10-10: qty 100

## 2021-10-10 MED ORDER — CEFAZOLIN IN SODIUM CHLORIDE 3-0.9 GM/100ML-% IV SOLN
3.0000 g | INTRAVENOUS | Status: AC
  Administered 2021-10-10: 3 g via INTRAVENOUS

## 2021-10-10 MED ORDER — CHLORHEXIDINE GLUCONATE 0.12 % MT SOLN
15.0000 mL | Freq: Once | OROMUCOSAL | Status: AC
  Administered 2021-10-10: 15 mL via OROMUCOSAL

## 2021-10-10 MED ORDER — ROCURONIUM BROMIDE 10 MG/ML (PF) SYRINGE
PREFILLED_SYRINGE | INTRAVENOUS | Status: DC | PRN
  Administered 2021-10-10: 70 mg via INTRAVENOUS
  Administered 2021-10-10: 30 mg via INTRAVENOUS

## 2021-10-10 MED ORDER — BUPIVACAINE-EPINEPHRINE 0.25% -1:200000 IJ SOLN
INTRAMUSCULAR | Status: DC | PRN
  Administered 2021-10-10: 9 mL

## 2021-10-10 MED ORDER — MIDAZOLAM HCL 2 MG/2ML IJ SOLN
INTRAMUSCULAR | Status: DC | PRN
  Administered 2021-10-10: 2 mg via INTRAVENOUS

## 2021-10-10 MED ORDER — THROMBIN 20000 UNITS EX SOLR
CUTANEOUS | Status: DC | PRN
  Administered 2021-10-10: 20000 [IU] via TOPICAL

## 2021-10-10 MED ORDER — FENTANYL CITRATE (PF) 100 MCG/2ML IJ SOLN
25.0000 ug | INTRAMUSCULAR | Status: DC | PRN
  Administered 2021-10-10 (×2): 50 ug via INTRAVENOUS

## 2021-10-10 MED ORDER — METHOCARBAMOL 750 MG PO TABS: 750.0000 mg | ORAL_TABLET | Freq: Four times a day (QID) | ORAL | 0 refills | Status: AC | PRN

## 2021-10-10 MED ORDER — 0.9 % SODIUM CHLORIDE (POUR BTL) OPTIME
TOPICAL | Status: DC | PRN
  Administered 2021-10-10: 1000 mL

## 2021-10-10 MED ORDER — DEXAMETHASONE SODIUM PHOSPHATE 10 MG/ML IJ SOLN
INTRAMUSCULAR | Status: DC | PRN
  Administered 2021-10-10: 10 mg via INTRAVENOUS

## 2021-10-10 MED ORDER — HYDROCODONE-ACETAMINOPHEN 5-325 MG PO TABS: 1.0000 | ORAL_TABLET | Freq: Four times a day (QID) | ORAL | 0 refills | Status: AC | PRN

## 2021-10-10 MED ORDER — LACTATED RINGERS IV SOLN: INTRAVENOUS | Status: DC

## 2021-10-10 MED ORDER — ORAL CARE MOUTH RINSE: 15.0000 mL | Freq: Once | OROMUCOSAL | Status: AC

## 2021-10-10 MED ORDER — LIDOCAINE 2% (20 MG/ML) 5 ML SYRINGE
INTRAMUSCULAR | Status: DC | PRN
  Administered 2021-10-10: 80 mg via INTRAVENOUS

## 2021-10-10 MED ORDER — HEMOSTATIC AGENTS (NO CHARGE) OPTIME
TOPICAL | Status: DC | PRN
  Administered 2021-10-10: 1

## 2021-10-10 MED ORDER — MIDAZOLAM HCL 2 MG/2ML IJ SOLN
INTRAMUSCULAR | Status: AC
  Filled 2021-10-10: qty 2

## 2021-10-10 MED ORDER — ACETAMINOPHEN 500 MG PO TABS
ORAL_TABLET | ORAL | Status: AC
  Administered 2021-10-10: 1000 mg via ORAL
  Filled 2021-10-10: qty 2

## 2021-10-10 MED ORDER — POVIDONE-IODINE 7.5 % EX SOLN
Freq: Once | CUTANEOUS | Status: DC
  Filled 2021-10-10: qty 118

## 2021-10-10 MED ORDER — PROPOFOL 500 MG/50ML IV EMUL
INTRAVENOUS | Status: DC | PRN
  Administered 2021-10-10: 75 ug/kg/min via INTRAVENOUS

## 2021-10-10 SURGICAL SUPPLY — 84 items
AGENT HMST KT MTR STRL THRMB (HEMOSTASIS)
APL SKNCLS STERI-STRIP NONHPOA (GAUZE/BANDAGES/DRESSINGS)
BAG COUNTER SPONGE SURGICOUNT (BAG) ×1 IMPLANT
BAG SPNG CNTER NS LX DISP (BAG) ×1
BENZOIN TINCTURE AMPULE (MISCELLANEOUS) IMPLANT
BENZOIN TINCTURE PRP APPL 2/3 (GAUZE/BANDAGES/DRESSINGS) ×1 IMPLANT
BIT DRILL NEURO 2X3.1 SFT TUCH (MISCELLANEOUS) ×1 IMPLANT
BIT DRILL SRG 14X2.2XFLT CHK (BIT) IMPLANT
BIT DRL SRG 14X2.2XFLT CHK (BIT) ×1
BLADE CLIPPER SURG (BLADE) ×1 IMPLANT
BLADE SURG 15 STRL LF DISP TIS (BLADE) ×1 IMPLANT
BLADE SURG 15 STRL SS (BLADE) ×1
BONE VIVIGEN FORMABLE 1.3CC (Bone Implant) ×2 IMPLANT
CARTRIDGE OIL MAESTRO DRILL (MISCELLANEOUS) ×1 IMPLANT
CLOSURE STERI STRIP 1/2 X4 (GAUZE/BANDAGES/DRESSINGS) IMPLANT
COLLAR CERV LO CONTOUR FIRM DE (SOFTGOODS) IMPLANT
CORD BIPOLAR FORCEPS 12FT (ELECTRODE) ×1 IMPLANT
COVER SURGICAL LIGHT HANDLE (MISCELLANEOUS) ×1 IMPLANT
DEVICE ENDSKLTN IMPLANT SM 7MM (Cage) IMPLANT
DIFFUSER DRILL AIR PNEUMATIC (MISCELLANEOUS) ×1 IMPLANT
DRAIN JACKSON RD 7FR 3/32 (WOUND CARE) IMPLANT
DRAPE C-ARM 42X72 X-RAY (DRAPES) ×1 IMPLANT
DRAPE POUCH INSTRU U-SHP 10X18 (DRAPES) ×1 IMPLANT
DRAPE SURG 17X23 STRL (DRAPES) ×4 IMPLANT
DRILL BIT SKYLINE 14MM (BIT) ×1
DRILL NEURO 2X3.1 SOFT TOUCH (MISCELLANEOUS) ×1
DURAPREP 26ML APPLICATOR (WOUND CARE) ×1 IMPLANT
ELECT COATED BLADE 2.86 ST (ELECTRODE) ×1 IMPLANT
ELECT REM PT RETURN 9FT ADLT (ELECTROSURGICAL) ×1
ELECTRODE REM PT RTRN 9FT ADLT (ELECTROSURGICAL) ×1 IMPLANT
ENDOSKELETON IMPLANT SM 7MM (Cage) ×1 IMPLANT
EVACUATOR SILICONE 100CC (DRAIN) IMPLANT
GAUZE 4X4 16PLY ~~LOC~~+RFID DBL (SPONGE) ×1 IMPLANT
GAUZE SPONGE 4X4 12PLY STRL (GAUZE/BANDAGES/DRESSINGS) ×1 IMPLANT
GLOVE BIO SURGEON STRL SZ7 (GLOVE) ×1 IMPLANT
GLOVE BIO SURGEON STRL SZ8 (GLOVE) ×1 IMPLANT
GLOVE BIOGEL PI IND STRL 7.0 (GLOVE) ×2 IMPLANT
GLOVE BIOGEL PI IND STRL 8 (GLOVE) ×1 IMPLANT
GLOVE BIOGEL PI INDICATOR 7.0 (GLOVE) ×2
GLOVE BIOGEL PI INDICATOR 8 (GLOVE) ×1
GLOVE SURG ENC MOIS LTX SZ6.5 (GLOVE) ×1 IMPLANT
GOWN STRL REUS W/ TWL LRG LVL3 (GOWN DISPOSABLE) ×1 IMPLANT
GOWN STRL REUS W/ TWL XL LVL3 (GOWN DISPOSABLE) ×1 IMPLANT
GOWN STRL REUS W/TWL LRG LVL3 (GOWN DISPOSABLE) ×1
GOWN STRL REUS W/TWL XL LVL3 (GOWN DISPOSABLE) ×1
GRAFT BNE MATRIX VG FRMBL SM 1 (Bone Implant) IMPLANT
INTERLOCK LRDTC CRVCL VBR 8MM (Peek) IMPLANT
IV CATH 14GX2 1/4 (CATHETERS) ×1 IMPLANT
KIT BASIN OR (CUSTOM PROCEDURE TRAY) ×1 IMPLANT
KIT TURNOVER KIT B (KITS) ×1 IMPLANT
LORDOTIC CERVICAL VBR 8MM SM (Peek) ×1 IMPLANT
MANIFOLD NEPTUNE II (INSTRUMENTS) ×1 IMPLANT
NDL PRECISIONGLIDE 27X1.5 (NEEDLE) ×1 IMPLANT
NDL SPNL 20GX3.5 QUINCKE YW (NEEDLE) ×1 IMPLANT
NEEDLE PRECISIONGLIDE 27X1.5 (NEEDLE) ×1 IMPLANT
NEEDLE SPNL 20GX3.5 QUINCKE YW (NEEDLE) ×1 IMPLANT
NS IRRIG 1000ML POUR BTL (IV SOLUTION) ×1 IMPLANT
OIL CARTRIDGE MAESTRO DRILL (MISCELLANEOUS) ×1
PACK ORTHO CERVICAL (CUSTOM PROCEDURE TRAY) ×1 IMPLANT
PAD ARMBOARD 7.5X6 YLW CONV (MISCELLANEOUS) ×2 IMPLANT
PATTIES SURGICAL .5 X.5 (GAUZE/BANDAGES/DRESSINGS) IMPLANT
PATTIES SURGICAL .5 X1 (DISPOSABLE) ×1 IMPLANT
PIN DISTRACTION 14 (PIN) IMPLANT
PLATE SKYLINE TWO LEVEL 32MM (Plate) IMPLANT
POSITIONER HEAD DONUT 9IN (MISCELLANEOUS) ×1 IMPLANT
SCREW SKYLINE VAR OS 14MM (Screw) IMPLANT
SPIKE FLUID TRANSFER (MISCELLANEOUS) ×1 IMPLANT
SPONGE INTESTINAL PEANUT (DISPOSABLE) ×1 IMPLANT
SPONGE SURGIFOAM ABS GEL 100 (HEMOSTASIS) ×1 IMPLANT
STRIP CLOSURE SKIN 1/2X4 (GAUZE/BANDAGES/DRESSINGS) ×1 IMPLANT
SURGIFLO W/THROMBIN 8M KIT (HEMOSTASIS) IMPLANT
SUT MNCRL AB 4-0 PS2 18 (SUTURE) ×1 IMPLANT
SUT SILK 4 0 (SUTURE)
SUT SILK 4-0 18XBRD TIE 12 (SUTURE) IMPLANT
SUT VIC AB 2-0 CT2 18 VCP726D (SUTURE) ×1 IMPLANT
SYR BULB IRRIG 60ML STRL (SYRINGE) ×1 IMPLANT
SYR CONTROL 10ML LL (SYRINGE) ×3 IMPLANT
TAPE CLOTH 4X10 WHT NS (GAUZE/BANDAGES/DRESSINGS) ×1 IMPLANT
TAPE CLOTH SURG 4X10 WHT LF (GAUZE/BANDAGES/DRESSINGS) IMPLANT
TAPE UMBILICAL COTTON 1/8X30 (MISCELLANEOUS) ×1 IMPLANT
TOWEL GREEN STERILE (TOWEL DISPOSABLE) ×1 IMPLANT
TOWEL GREEN STERILE FF (TOWEL DISPOSABLE) ×1 IMPLANT
WATER STERILE IRR 1000ML POUR (IV SOLUTION) ×1 IMPLANT
YANKAUER SUCT BULB TIP NO VENT (SUCTIONS) ×1 IMPLANT

## 2021-10-10 NOTE — Anesthesia Preprocedure Evaluation (Signed)
Anesthesia Evaluation  Patient identified by MRN, date of birth, ID band Patient awake    Reviewed: Allergy & Precautions, NPO status , Patient's Chart, lab work & pertinent test results  Airway Mallampati: IV  TM Distance: >3 FB Neck ROM: Full    Dental  (+) Dental Advisory Given   Pulmonary asthma , sleep apnea and Continuous Positive Airway Pressure Ventilation ,    breath sounds clear to auscultation       Cardiovascular hypertension, Pt. on medications  Rhythm:Regular Rate:Normal     Neuro/Psych negative neurological ROS     GI/Hepatic Neg liver ROS, GERD  ,  Endo/Other  negative endocrine ROS  Renal/GU negative Renal ROS     Musculoskeletal  (+) Arthritis ,   Abdominal   Peds  Hematology negative hematology ROS (+)   Anesthesia Other Findings   Reproductive/Obstetrics                             Anesthesia Physical Anesthesia Plan  ASA: 3  Anesthesia Plan: General   Post-op Pain Management: Tylenol PO (pre-op)*   Induction:   PONV Risk Score and Plan: 2 and Ondansetron, Treatment may vary due to age or medical condition and Dexamethasone  Airway Management Planned: Oral ETT  Additional Equipment: None  Intra-op Plan:   Post-operative Plan: Extubation in OR  Informed Consent: I have reviewed the patients History and Physical, chart, labs and discussed the procedure including the risks, benefits and alternatives for the proposed anesthesia with the patient or authorized representative who has indicated his/her understanding and acceptance.     Dental advisory given  Plan Discussed with: CRNA  Anesthesia Plan Comments:         Anesthesia Quick Evaluation

## 2021-10-10 NOTE — Transfer of Care (Signed)
Immediate Anesthesia Transfer of Care Note  Patient: Carl Graves  Procedure(s) Performed: ANTERIOR CERVICAL DECOMPRESSION FUSION CERVICAL 5- CERVICAL 6, CERVICAL 6- CERVICAL 7 WITH INSTRUMENTATION AND ALLOGRAFT (Spine Cervical)  Patient Location: PACU  Anesthesia Type:General  Level of Consciousness: awake  Airway & Oxygen Therapy: Patient Spontanous Breathing and Patient connected to nasal cannula oxygen  Post-op Assessment: Report given to RN and Post -op Vital signs reviewed and stable  Post vital signs: Reviewed and stable  Last Vitals:  Vitals Value Taken Time  BP 126/72 10/10/21 1037  Temp 36.6 C 10/10/21 1037  Pulse 84 10/10/21 1040  Resp 15 10/10/21 1040  SpO2 91 % 10/10/21 1040  Vitals shown include unvalidated device data.  Last Pain:  Vitals:   10/10/21 0714  TempSrc: Oral  PainSc:       Patients Stated Pain Goal: 1 (10/10/21 0629)  Complications: No notable events documented.

## 2021-10-10 NOTE — Anesthesia Postprocedure Evaluation (Signed)
Anesthesia Post Note  Patient: Carl Graves  Procedure(s) Performed: ANTERIOR CERVICAL DECOMPRESSION FUSION CERVICAL 5- CERVICAL 6, CERVICAL 6- CERVICAL 7 WITH INSTRUMENTATION AND ALLOGRAFT (Spine Cervical)     Patient location during evaluation: PACU Anesthesia Type: General Level of consciousness: awake and alert Pain management: pain level controlled Vital Signs Assessment: post-procedure vital signs reviewed and stable Respiratory status: spontaneous breathing, nonlabored ventilation, respiratory function stable and patient connected to nasal cannula oxygen Cardiovascular status: blood pressure returned to baseline and stable Postop Assessment: no apparent nausea or vomiting Anesthetic complications: no   No notable events documented.  Last Vitals:  Vitals:   10/10/21 1145 10/10/21 1200  BP: (!) 140/72 (!) 147/86  Pulse: 77 79  Resp: 12 19  Temp: 36.5 C   SpO2: 94% 93%    Last Pain:  Vitals:   10/10/21 1200  TempSrc:   PainSc: 4                  Kennieth Rad

## 2021-10-10 NOTE — Op Note (Signed)
PATIENT NAME: Carl Graves   MEDICAL RECORD NO.:   [id number]    DATE OF BIRTH: 12-Jun-1965   DATE OF PROCEDURE: 10/10/2021                               OPERATIVE REPORT     PREOPERATIVE DIAGNOSES: 1. Left-sided cervical radiculopathy. 2. Spinal stenosis spanning C5-C7.   POSTOPERATIVE DIAGNOSES: 1. Left-sided cervical radiculopathy. 2. Spinal stenosis spanning C5-C7.   PROCEDURE: 1. Anterior cervical decompression and fusion C5-6, C6-7. 2. Placement of anterior instrumentation, C5-C7 3. Insertion of interbody device x2 (Titan intervertebral spacers). 4. Intraoperative use of fluoroscopy. 5. Use of morselized allograft - ViviGen.   SURGEON:  Estill Bamberg, MD   ASSISTANT:  Jason Coop, PA-C.   ANESTHESIA:  General endotracheal anesthesia.   COMPLICATIONS:  None.   DISPOSITION:  Stable.   ESTIMATED BLOOD LOSS:  Minimal.   INDICATIONS FOR SURGERY:  Briefly, Mr. Irion is a pleasant 56 y.o. -year- old patient, who did present to me with severe pain in his neck and left arm.  The patient's MRI did reveal the findings noted above.  Given the patient's ongoing rather debilitating pain and lack of improvement with appropriate treatment measures, we did discuss proceeding with the procedure noted above.  The patient was fully aware of the risks and limitations of surgery as outlined in my preoperative note.   OPERATIVE DETAILS:  On 10/10/2021 the patient was brought to surgery and general endotracheal anesthesia was administered.  The patient was placed supine on the hospital bed. The neck was gently extended.  All bony prominences were meticulously padded.  The neck was prepped and draped in the usual sterile fashion.  At this point, I did make a left-sided transverse incision.  The platysma was incised.  A Smith-Robinson approach was used and the anterior spine was identified. A self-retaining retractor was placed.  I then subperiosteally exposed the vertebral bodies  from C5-C7.  Caspar pins were then placed into the C6 and C7 vertebral bodies and distraction was applied.  A thorough and complete C6-7 intervertebral diskectomy was performed.  The posterior longitudinal ligament was identified and entered using a nerve hook.  I then used #1 followed by #2 Kerrison to perform a thorough and complete intervertebral diskectomy.  The spinal canal was thoroughly decompressed, as was the left neuroforamen.  The endplates were then prepared and the appropriate-sized intervertebral spacer was then packed with ViviGen and tamped into position in the usual fashion.  The lower Caspar pin was then removed and placed into the C5 vertebral body and once again, distraction was applied across the C5-6 intervertebral space.  I then again performed a thorough and complete diskectomy, thoroughly decompressing the spinal canal and left neuroforamen.  After preparing the endplates, the appropriate-sized intervertebral spacer was packed with ViviGen and tamped into position.  The Caspar pins then were removed and bone wax was placed in their place.  The appropriate-sized anterior cervical plate was placed over the anterior spine.  14 mm variable angle screws were placed, 2 in each vertebral body from C5-C7 for a total of 6 vertebral body screws.  The screws were then locked to the plate using the Cam locking mechanism.  I was very pleased with the final fluoroscopic images.  The wound was then irrigated.  The wound was then explored for any undue bleeding and there was no bleeding noted. The wound was then closed in  layers using 2-0 Vicryl, followed by 4-0 Monocryl.  Benzoin and Steri-Strips were applied, followed by sterile dressing.  All instrument counts were correct at the termination of the procedure.   Of note, Jason Coop, PA-C, was my assistant throughout surgery, and did aid in retraction, suctioning, and closure from start to finish.         Estill Bamberg,  MD

## 2021-10-10 NOTE — Anesthesia Procedure Notes (Signed)
Procedure Name: Intubation Date/Time: 10/10/2021 8:05 AM  Performed by: Minerva Ends, CRNAPre-anesthesia Checklist: Patient identified, Emergency Drugs available, Suction available and Patient being monitored Patient Re-evaluated:Patient Re-evaluated prior to induction Oxygen Delivery Method: Circle system utilized Preoxygenation: Pre-oxygenation with 100% oxygen Induction Type: IV induction Ventilation: Mask ventilation without difficulty Laryngoscope Size: Glidescope, Mac and 3 Grade View: Grade I Tube type: Oral Tube size: 7.0 mm Number of attempts: 1 Airway Equipment and Method: Stylet and Oral airway Placement Confirmation: ETT inserted through vocal cords under direct vision, positive ETCO2 and breath sounds checked- equal and bilateral Secured at: 23 cm Tube secured with: Tape Dental Injury: Teeth and Oropharynx as per pre-operative assessment

## 2021-10-10 NOTE — H&P (Signed)
PREOPERATIVE H&P  Chief Complaint: Left arm pain  HPI: Carl Graves is a 56 y.o. male who presents with ongoing pain in the left arm  MRI reveals stenosis at C5/6 and C6/7  Patient has failed multiple forms of conservative care and continues to have pain (see office notes for additional details regarding the patient's full course of treatment)  Past Medical History:  Diagnosis Date   Acid reflux    occasional - no current med.   Allergic to dogs    Arthritis    Asthma    hyper reactive disease   Cat allergies    Complication of anesthesia    states is hard to wake up post-op   COVID    December 2022/January 2022 - moderate symptoms   Dental crown present    Fracture of coronoid process of left ulna 12/10/2014   Hypertension    has been on med. x 10 yr.; not completely controlled, per pt.   Joint disorder    states jaw locks if opens mouth wide   Left radial head fracture 12/10/2014   Ligament tear 12/10/2014   lateral collateral ligament tear elbow   Pneumonia    Pollen allergy    Pre-diabetes    Reactive airway disease    daily and prn inhalers   Sleep apnea    cpap   Tinnitus of both ears    Past Surgical History:  Procedure Laterality Date   ANKLE SURGERY Right age 82   repair tendons and ligaments   CARPAL TUNNEL RELEASE Right 07/11/2009   CARPAL TUNNEL RELEASE Left 08/15/2009   COLONOSCOPY WITH PROPOFOL N/A 03/03/2016   Procedure: COLONOSCOPY WITH PROPOFOL;  Surgeon: Charolett Bumpers, MD;  Location: [facility];  Service: Endoscopy;  Laterality: N/A;   ORIF ELBOW FRACTURE Left 12/26/2014   Procedure: CORONOID OPEN REDUCTION INTERNAL FIXATION (ORIF) ;  Surgeon: Betha Loa, MD;  Location: [facility];  Service: Orthopedics;  Laterality: Left;   RADIAL HEAD ARTHROPLASTY Left 12/26/2014   Procedure: LEFT RADIAL HEAD ARTHROPLASTY;  Surgeon: Betha Loa, MD;  Location: [facility];  Service: Orthopedics;  Laterality: Left;    SHOULDER ARTHROSCOPY Right    ULNAR COLLATERAL LIGAMENT REPAIR Left 12/26/2014   Procedure: REPAIR LATERAL COLLATERAL LIGAMENT ;  Surgeon: Betha Loa, MD;  Location: [facility];  Service: Orthopedics;  Laterality: Left;   Social History   Socioeconomic History   Marital status: Married    Spouse name: Not on file   Number of children: Not on file   Years of education: Not on file   Highest education level: Not on file  Occupational History   Not on file  Tobacco Use   Smoking status: Never    Passive exposure: Past   Smokeless tobacco: Never  Vaping Use   Vaping Use: Never used  Substance and Sexual Activity   Alcohol use: Yes    Comment: occasionally   Drug use: No   Sexual activity: Not on file  Other Topics Concern   Not on file  Social History Narrative   Not on file   Social Determinants of Health   Financial Resource Strain: Not on file  Food Insecurity: Not on file  Transportation Needs: Not on file  Physical Activity: Not on file  Stress: Not on file  Social Connections: Not on file   History reviewed. No pertinent family history. Allergies  Allergen Reactions   Diclofenac Anaphylaxis   Amlodipine  Other (See Comments)    SWELLING OF LEGS   Clarithromycin Nausea And Vomiting   Prior to Admission medications   Medication Sig Start Date End Date Taking? Authorizing Provider  acetaminophen (TYLENOL) 500 MG tablet Take 1,000 mg by mouth every 6 (six) hours as needed for moderate pain.   Yes [provider]  albuterol (PROVENTIL HFA;VENTOLIN HFA) 108 (90 BASE) MCG/ACT inhaler Inhale 1-2 puffs into the lungs every 6 (six) hours as needed for wheezing or shortness of breath.   Yes [provider]  benazepril (LOTENSIN) 20 MG tablet Take 20 mg by mouth daily.   Yes [provider]  fluticasone-salmeterol (ADVAIR) 250-50 MCG/ACT AEPB Inhale 1 puff into the lungs 2 (two) times daily. 09/11/21  Yes [provider]   gabapentin (NEURONTIN) 300 MG capsule Take 300 mg by mouth 3 (three) times daily.   Yes [provider]  hydrochlorothiazide (HYDRODIURIL) 12.5 MG tablet Take 12.5 mg by mouth daily.   Yes [provider]  loratadine (CLARITIN) 10 MG tablet Take 10 mg by mouth every evening.    Yes [provider]  meloxicam (MOBIC) 15 MG tablet Take 15 mg by mouth daily.   Yes [provider]  montelukast (SINGULAIR) 10 MG tablet Take 10 mg by mouth at bedtime.     Yes [provider]  NON FORMULARY Pt uses a cpap nightly   Yes [provider]  pantoprazole (PROTONIX) 40 MG tablet Take 40 mg by mouth daily.   Yes [provider]  traMADol (ULTRAM) 50 MG tablet Take 50 mg by mouth every 6 (six) hours as needed for moderate pain.   Yes [provider]  azithromycin (ZITHROMAX) 250 MG tablet Take 1 tablet (250 mg total) by mouth daily. Take first 2 tablets together, then 1 every day until finished. Patient not taking: Reported on 09/26/2021 06/15/16   Ward, Layla Maw, DO  benzonatate (TESSALON) 100 MG capsule Take 100 mg by mouth 3 (three) times daily as needed for cough.    [provider]  cyclobenzaprine (FLEXERIL) 10 MG tablet Take 1 tablet (10 mg total) by mouth at bedtime as needed for muscle spasms. Patient not taking: Reported on 09/26/2021 11/28/16   Belinda Fisher, PA-C  ondansetron (ZOFRAN ODT) 4 MG disintegrating tablet Take 1 tablet (4 mg total) by mouth every 8 (eight) hours as needed for nausea or vomiting. Patient not taking: Reported on 09/26/2021 06/15/16   Ward, Layla Maw, DO     All other systems have been reviewed and were otherwise negative with the exception of those mentioned in the HPI and as above.  Physical Exam: There were no vitals filed for this visit.  There is no height or weight on file to calculate BMI.  General: Alert, no acute distress Cardiovascular: No pedal edema Respiratory: No cyanosis, no use of  accessory musculature Skin: No lesions in the area of chief complaint Neurologic: Sensation intact distally Psychiatric: Patient is competent for consent with normal mood and affect Lymphatic: No axillary or cervical lymphadenopathy   Assessment/Plan: Left-sided cervical radiculopathy, which has now been present for over 4 months.  Plan for Procedure(s): ANTERIOR CERVICAL DECOMPRESSION FUSION CERVICAL 5- CERVICAL 6, CERVICAL 6- CERVICAL 7 WITH INSTRUMENTATION AND ALLOGRAFT   Jackelyn Hoehn, MD 10/10/2021 6:47 AM

## 2021-10-14 ENCOUNTER — Encounter (HOSPITAL_COMMUNITY): Payer: Self-pay | Admitting: Orthopedic Surgery

## 2022-10-22 ENCOUNTER — Other Ambulatory Visit: Payer: Self-pay

## 2022-10-22 ENCOUNTER — Emergency Department (HOSPITAL_COMMUNITY): Payer: No Typology Code available for payment source

## 2022-10-22 ENCOUNTER — Emergency Department (HOSPITAL_COMMUNITY)
Admission: EM | Admit: 2022-10-22 | Discharge: 2022-10-22 | Disposition: A | Discharge status: Home / Self Care | Payer: No Typology Code available for payment source

## 2022-10-22 DIAGNOSIS — R55 Syncope and collapse: Secondary | ICD-10-CM | POA: Insufficient documentation

## 2022-10-22 DIAGNOSIS — R112 Nausea with vomiting, unspecified: Secondary | ICD-10-CM | POA: Diagnosis not present

## 2022-10-22 DIAGNOSIS — Z79899 Other long term (current) drug therapy: Secondary | ICD-10-CM | POA: Diagnosis not present

## 2022-10-22 DIAGNOSIS — R197 Diarrhea, unspecified: Secondary | ICD-10-CM | POA: Insufficient documentation

## 2022-10-22 LAB — COMPREHENSIVE METABOLIC PANEL
ALT: 22 U/L (ref 0–44)
AST: 22 U/L (ref 15–41)
Albumin: 4 g/dL (ref 3.5–5.0)
Alkaline Phosphatase: 53 U/L (ref 38–126)
Anion gap: 14 (ref 5–15)
BUN: 20 mg/dL (ref 6–20)
CO2: 21 mmol/L — ABNORMAL LOW (ref 22–32)
Calcium: 9.1 mg/dL (ref 8.9–10.3)
Chloride: 101 mmol/L (ref 98–111)
Creatinine, Ser: 1.18 mg/dL (ref 0.61–1.24)
GFR, Estimated: >60 mL/min (ref >60)
Glucose, Bld: 93 mg/dL (ref 70–99)
Potassium: 3.5 mmol/L (ref 3.5–5.1)
Sodium: 136 mmol/L (ref 135–145)
Total Bilirubin: 1.3 mg/dL — ABNORMAL HIGH (ref 0.3–1.2)
Total Protein: 7.4 g/dL (ref 6.5–8.1)

## 2022-10-22 LAB — CBC
HCT: 43.6 % (ref 39.0–52.0)
Hemoglobin: 14.9 g/dL (ref 13.0–17.0)
MCH: 30.5 pg (ref 26.0–34.0)
MCHC: 34.2 g/dL (ref 30.0–36.0)
MCV: 89.2 fL (ref 80.0–100.0)
Platelets: 211 10*3/uL (ref 150–400)
RBC: 4.89 MIL/uL (ref 4.22–5.81)
RDW: 13.2 % (ref 11.5–15.5)
WBC: 11.9 10*3/uL — ABNORMAL HIGH (ref 4.0–10.5)
nRBC: 0 % (ref 0.0–0.2)

## 2022-10-22 LAB — LIPASE, BLOOD: Lipase: 23 U/L (ref 11–51)

## 2022-10-22 MED ORDER — LACTATED RINGERS IV BOLUS
1000.0000 mL | Freq: Once | INTRAVENOUS | Status: AC
  Administered 2022-10-22: 1000 mL via INTRAVENOUS

## 2022-10-22 NOTE — ED Triage Notes (Signed)
Pt BIB EMS from work after syncopal episode at work, pt states he felt dizzy prior to episode. N/V x 3 days.

## 2022-10-22 NOTE — Discharge Instructions (Signed)
Please follow-up with your primary doctor soon as possible.  Return to emerged part immediately if develop fevers, chills, chest pain, shortness of breath, passout or develop any new or worsening symptoms that are concerning to you.

## 2022-10-22 NOTE — ED Provider Notes (Signed)
[facility] AT [facility] Provider Note   CSN: [id number] Arrival date & time: 10/22/22  1219     History  Chief Complaint  Patient presents with   Loss of Consciousness    MARLOW GUARD is a 57 y.o. male.  This is a 57 year old male present emergency department for near syncope.  He reports that he has had some nausea vomiting and diarrhea for the past 2 days.  Went to work today has tingling symptoms have improved.  He works outside in the sun, states he felt overheated.  Laid down and then when he stood up he was dizzy near syncopal.  No headache, vision changes, chest pain or shortness of breath.  Denies any abdominal pain.   Loss of Consciousness      Home Medications Prior to Admission medications   Medication Sig Start Date End Date Taking? Authorizing Provider  acetaminophen (TYLENOL) 500 MG tablet Take 1,000 mg by mouth every 6 (six) hours as needed for moderate pain.    [provider]  albuterol (PROVENTIL HFA;VENTOLIN HFA) 108 (90 BASE) MCG/ACT inhaler Inhale 1-2 puffs into the lungs every 6 (six) hours as needed for wheezing or shortness of breath.    [provider]  azithromycin (ZITHROMAX) 250 MG tablet Take 1 tablet (250 mg total) by mouth daily. Take first 2 tablets together, then 1 every day until finished. Patient not taking: Reported on 09/26/2021 06/15/16   Ward, Layla Maw, DO  benazepril (LOTENSIN) 20 MG tablet Take 20 mg by mouth daily.    [provider]  benzonatate (TESSALON) 100 MG capsule Take 100 mg by mouth 3 (three) times daily as needed for cough.    [provider]  cyclobenzaprine (FLEXERIL) 10 MG tablet Take 1 tablet (10 mg total) by mouth at bedtime as needed for muscle spasms. Patient not taking: Reported on 09/26/2021 11/28/16   Belinda Fisher, PA-C  fluticasone-salmeterol (ADVAIR) 250-50 MCG/ACT AEPB Inhale 1 puff into the lungs 2 (two) times daily. 09/11/21   [provider]   gabapentin (NEURONTIN) 300 MG capsule Take 300 mg by mouth 3 (three) times daily.    [provider]  hydrochlorothiazide (HYDRODIURIL) 12.5 MG tablet Take 12.5 mg by mouth daily.    [provider]  loratadine (CLARITIN) 10 MG tablet Take 10 mg by mouth every evening.     [provider]  methocarbamol (ROBAXIN) 750 MG tablet Take 1 tablet (750 mg total) by mouth every 6 (six) hours as needed for muscle spasms. 10/10/21   McKenzie, Eilene Ghazi, PA-C  montelukast (SINGULAIR) 10 MG tablet Take 10 mg by mouth at bedtime.      [provider]  NON FORMULARY Pt uses a cpap nightly    [provider]  ondansetron (ZOFRAN ODT) 4 MG disintegrating tablet Take 1 tablet (4 mg total) by mouth every 8 (eight) hours as needed for nausea or vomiting. Patient not taking: Reported on 09/26/2021 06/15/16   Ward, Layla Maw, DO  pantoprazole (PROTONIX) 40 MG tablet Take 40 mg by mouth daily.    [provider]  traMADol (ULTRAM) 50 MG tablet Take 50 mg by mouth every 6 (six) hours as needed for moderate pain.    [provider]      Allergies    Diclofenac, Amlodipine, and Clarithromycin    Review of Systems   Review of Systems  Cardiovascular:  Positive for syncope.    Physical Exam Updated Vital Signs  BP (!) 138/90 (BP Location: Right Arm)   Pulse 77   Temp 98.9 F (37.2 C) (Oral)   Resp 16   Ht 5\' 8"  (1.727 m)   Wt 133 kg   SpO2 94%   BMI 44.58 kg/m  Physical Exam Vitals and nursing note reviewed.  Constitutional:      General: He is not in acute distress.    Appearance: He is obese. He is not toxic-appearing.  HENT:     Mouth/Throat:     Mouth: Mucous membranes are moist.  Cardiovascular:     Rate and Rhythm: Normal rate and regular rhythm.  Pulmonary:     Effort: Pulmonary effort is normal.  Abdominal:     General: Abdomen is flat. There is no distension.     Tenderness: There is no abdominal tenderness. There is no guarding or  rebound.  Musculoskeletal:        General: Normal range of motion.  Skin:    General: Skin is dry.     Capillary Refill: Capillary refill takes less than 2 seconds.  Neurological:     General: No focal deficit present.     Mental Status: He is alert.     Cranial Nerves: No cranial nerve deficit.     Sensory: No sensory deficit.     Motor: No weakness.     Coordination: Coordination normal.  Psychiatric:        Behavior: Behavior normal.     ED Results / Procedures / Treatments   Labs (all labs ordered are listed, but only abnormal results are displayed) Labs Reviewed  CBC - Abnormal; Notable for the following components:      Result Value   WBC 11.9 (*)    All other components within normal limits  COMPREHENSIVE METABOLIC PANEL - Abnormal; Notable for the following components:   CO2 21 (*)    Total Bilirubin 1.3 (*)    All other components within normal limits  LIPASE, BLOOD    EKG EKG Interpretation Date/Time:  Wednesday October 22 2022 12:34:01 EDT Ventricular Rate:  86 PR Interval:  174 QRS Duration:  82 QT Interval:  340 QTC Calculation: 406 R Axis:   -3  Text Interpretation: Normal sinus rhythm Normal ECG When compared with ECG of 03-Oct-2021 15:08, PREVIOUS ECG IS PRESENT Confirmed by Estanislado Pandy [phone]) on 10/22/2022 1:13:55 PM  Radiology DG Chest Portable 1 View  Result Date: 10/22/2022 CLINICAL DATA:  Syncope EXAM: PORTABLE CHEST 1 VIEW COMPARISON:  Chest radiograph dated 06/14/2016 FINDINGS: Normal lung volumes. No focal consolidations. No pleural effusion or pneumothorax. The heart size and mediastinal contours are within normal limits. Cervical spinal fixation hardware appears intact. No radiographic finding of acute displaced fracture. IMPRESSION: 1. No active disease. 2.  No radiographic finding of acute displaced fracture. Electronically Signed   By: Agustin Cree M.D.   On: 10/22/2022 13:33    Procedures Procedures    Medications Ordered in  ED Medications  lactated ringers bolus 1,000 mL (0 mLs Intravenous Stopped 10/22/22 1534)    ED Course/ Medical Decision Making/ A&P                                 Medical Decision Making Well-appearing 57 year old male present emergency department for near syncopal episode.  Afebrile nontachycardic, blood pressure soft on arrival.  EMS also reported blood pressure in the 80s and gave fluids prior to arrival.  Suspect  hypovolemia as patient is orthostatic positive in his history with decreased p.o. intake with nausea vomiting diarrhea for the past several days point towards volume depletion.  He also did take blood pressure medications, and did take them this morning without check his blood pressure.  This could be a contributory factor.  His workup largely reassuring.  Mild leukocytosis would go along with the viral gastroenteritis type picture.  No anemia.  No significant metabolic derangements.  No transaminitis suggest hepatobiliary disease.  Lipase normal.  Pancreatitis unlikely.  Chest x-ray without pneumonia pneumothorax.  His ECG without ST segment change indicate ischemia.  No arrhythmia.  Observed in the emergency department with no arrhythmias.  Was given IV fluids with improvement of his blood pressure.  Patient was then ambulated by myself with no symptoms.  Will discharge at this time, follow-up PCP.  Return precautions given.  Amount and/or Complexity of Data Reviewed External Data Reviewed: notes.    Details: No prior cardiac history in our EMR Labs: ordered. Radiology: ordered. ECG/medicine tests: ordered.  Risk Decision regarding hospitalization. Risk Details: Patient blood pressure improved, symptoms resolved.  Negative workup.  Given his presentation is likely secondary to hypovolemia, unlikely benefit from acute hospitalization at this time.         Final Clinical Impression(s) / ED Diagnoses Final diagnoses:  None    Rx / DC Orders ED Discharge Orders      None         Coral Spikes, DO 10/22/22 1616
